# Patient Record
Sex: Female | Born: 1935 | Race: White | Hispanic: No | Marital: Married | Smoking: Never smoker
Health system: Southern US, Community
[De-identification: ages and names within clinical notes are randomized; demographics above are authoritative.]

## PROBLEM LIST (undated history)

## (undated) DIAGNOSIS — E785 Hyperlipidemia, unspecified: Secondary | ICD-10-CM

## (undated) DIAGNOSIS — C801 Malignant (primary) neoplasm, unspecified: Secondary | ICD-10-CM

## (undated) DIAGNOSIS — I10 Essential (primary) hypertension: Secondary | ICD-10-CM

## (undated) DIAGNOSIS — I2510 Atherosclerotic heart disease of native coronary artery without angina pectoris: Secondary | ICD-10-CM

## (undated) HISTORY — PX: TONSILLECTOMY: SUR1361

---

## 2000-08-28 DIAGNOSIS — I2510 Atherosclerotic heart disease of native coronary artery without angina pectoris: Secondary | ICD-10-CM

## 2000-08-28 DIAGNOSIS — C801 Malignant (primary) neoplasm, unspecified: Secondary | ICD-10-CM

## 2000-08-28 HISTORY — DX: Atherosclerotic heart disease of native coronary artery without angina pectoris: I25.10

## 2000-08-28 HISTORY — DX: Malignant (primary) neoplasm, unspecified: C80.1

## 2000-08-28 HISTORY — PX: NEPHRECTOMY: SHX65

## 2017-10-26 HISTORY — PX: ROTATOR CUFF REPAIR: SHX139

## 2018-01-25 ENCOUNTER — Observation Stay (HOSPITAL_COMMUNITY)
Admission: EM | Admit: 2018-01-25 | Discharge: 2018-01-28 | Disposition: A | Discharge status: Home / Self Care | Payer: Medicare Other | Attending: Internal Medicine | Admitting: Internal Medicine

## 2018-01-25 ENCOUNTER — Encounter (HOSPITAL_COMMUNITY): Payer: Self-pay | Admitting: Emergency Medicine

## 2018-01-25 ENCOUNTER — Emergency Department (HOSPITAL_COMMUNITY): Payer: Medicare Other

## 2018-01-25 DIAGNOSIS — R0609 Other forms of dyspnea: Secondary | ICD-10-CM

## 2018-01-25 DIAGNOSIS — I25110 Atherosclerotic heart disease of native coronary artery with unstable angina pectoris: Principal | ICD-10-CM | POA: Insufficient documentation

## 2018-01-25 DIAGNOSIS — F419 Anxiety disorder, unspecified: Secondary | ICD-10-CM | POA: Insufficient documentation

## 2018-01-25 DIAGNOSIS — J449 Chronic obstructive pulmonary disease, unspecified: Secondary | ICD-10-CM | POA: Insufficient documentation

## 2018-01-25 DIAGNOSIS — G4700 Insomnia, unspecified: Secondary | ICD-10-CM | POA: Diagnosis not present

## 2018-01-25 DIAGNOSIS — Z85528 Personal history of other malignant neoplasm of kidney: Secondary | ICD-10-CM | POA: Insufficient documentation

## 2018-01-25 DIAGNOSIS — I200 Unstable angina: Secondary | ICD-10-CM | POA: Insufficient documentation

## 2018-01-25 DIAGNOSIS — I209 Angina pectoris, unspecified: Secondary | ICD-10-CM

## 2018-01-25 DIAGNOSIS — Z87891 Personal history of nicotine dependence: Secondary | ICD-10-CM | POA: Diagnosis not present

## 2018-01-25 DIAGNOSIS — N179 Acute kidney failure, unspecified: Secondary | ICD-10-CM | POA: Diagnosis not present

## 2018-01-25 DIAGNOSIS — I25119 Atherosclerotic heart disease of native coronary artery with unspecified angina pectoris: Secondary | ICD-10-CM | POA: Diagnosis not present

## 2018-01-25 DIAGNOSIS — I129 Hypertensive chronic kidney disease with stage 1 through stage 4 chronic kidney disease, or unspecified chronic kidney disease: Secondary | ICD-10-CM | POA: Diagnosis not present

## 2018-01-25 DIAGNOSIS — N183 Chronic kidney disease, stage 3 (moderate): Secondary | ICD-10-CM | POA: Insufficient documentation

## 2018-01-25 DIAGNOSIS — R7989 Other specified abnormal findings of blood chemistry: Secondary | ICD-10-CM

## 2018-01-25 DIAGNOSIS — I2584 Coronary atherosclerosis due to calcified coronary lesion: Secondary | ICD-10-CM | POA: Diagnosis not present

## 2018-01-25 DIAGNOSIS — R0602 Shortness of breath: Secondary | ICD-10-CM

## 2018-01-25 DIAGNOSIS — E785 Hyperlipidemia, unspecified: Secondary | ICD-10-CM | POA: Insufficient documentation

## 2018-01-25 DIAGNOSIS — Z905 Acquired absence of kidney: Secondary | ICD-10-CM

## 2018-01-25 DIAGNOSIS — Z885 Allergy status to narcotic agent status: Secondary | ICD-10-CM | POA: Diagnosis not present

## 2018-01-25 DIAGNOSIS — I10 Essential (primary) hypertension: Secondary | ICD-10-CM | POA: Diagnosis not present

## 2018-01-25 DIAGNOSIS — I2510 Atherosclerotic heart disease of native coronary artery without angina pectoris: Secondary | ICD-10-CM

## 2018-01-25 HISTORY — DX: Hyperlipidemia, unspecified: E78.5

## 2018-01-25 HISTORY — DX: Atherosclerotic heart disease of native coronary artery without angina pectoris: I25.10

## 2018-01-25 HISTORY — DX: Malignant (primary) neoplasm, unspecified: C80.1

## 2018-01-25 HISTORY — DX: Essential (primary) hypertension: I10

## 2018-01-25 LAB — CBC
HCT: 40.6 % (ref 36.0–46.0)
HEMOGLOBIN: 13.2 g/dL (ref 12.0–15.0)
MCH: 29.3 pg (ref 26.0–34.0)
MCHC: 32.5 g/dL (ref 30.0–36.0)
MCV: 90 fL (ref 78.0–100.0)
Platelets: 244 10*3/uL (ref 150–400)
RBC: 4.51 MIL/uL (ref 3.87–5.11)
RDW: 13.6 % (ref 11.5–15.5)
WBC: 6.5 10*3/uL (ref 4.0–10.5)

## 2018-01-25 LAB — URINALYSIS, ROUTINE W REFLEX MICROSCOPIC
BILIRUBIN URINE: NEGATIVE
Bacteria, UA: NONE SEEN
GLUCOSE, UA: NEGATIVE mg/dL
Hgb urine dipstick: NEGATIVE
KETONES UR: NEGATIVE mg/dL
NITRITE: NEGATIVE
PH: 5 (ref 5.0–8.0)
Protein, ur: NEGATIVE mg/dL
SPECIFIC GRAVITY, URINE: 1.018 (ref 1.005–1.030)

## 2018-01-25 LAB — BASIC METABOLIC PANEL
ANION GAP: 9 (ref 5–15)
BUN: 24 mg/dL — ABNORMAL HIGH (ref 6–20)
CHLORIDE: 103 mmol/L (ref 101–111)
CO2: 28 mmol/L (ref 22–32)
Calcium: 9.1 mg/dL (ref 8.9–10.3)
Creatinine, Ser: 1.93 mg/dL — ABNORMAL HIGH (ref 0.44–1.00)
GFR calc non Af Amer: 23 mL/min — ABNORMAL LOW (ref >60)
GFR, EST AFRICAN AMERICAN: 27 mL/min — AB (ref >60)
Glucose, Bld: 101 mg/dL — ABNORMAL HIGH (ref 65–99)
POTASSIUM: 3.8 mmol/L (ref 3.5–5.1)
Sodium: 140 mmol/L (ref 135–145)

## 2018-01-25 LAB — I-STAT TROPONIN, ED: Troponin i, poc: 0 ng/mL (ref 0.00–0.08)

## 2018-01-25 LAB — D-DIMER, QUANTITATIVE (NOT AT ARMC): D-Dimer, Quant: 0.68 ug/mL-FEU — ABNORMAL HIGH (ref 0.00–0.50)

## 2018-01-25 MED ORDER — METOPROLOL TARTRATE 12.5 MG HALF TABLET
12.5000 mg | ORAL_TABLET | Freq: Two times a day (BID) | ORAL | Status: DC
  Administered 2018-01-26: 12.5 mg via ORAL
  Filled 2018-01-25: qty 1

## 2018-01-25 MED ORDER — ONDANSETRON HCL 4 MG/2ML IJ SOLN: 4.0000 mg | Freq: Four times a day (QID) | INTRAMUSCULAR | Status: DC | PRN

## 2018-01-25 MED ORDER — DEXTROSE-NACL 5-0.45 % IV SOLN
INTRAVENOUS | Status: DC
  Administered 2018-01-26 (×2): via INTRAVENOUS

## 2018-01-25 MED ORDER — ZOLPIDEM TARTRATE 5 MG PO TABS
5.0000 mg | ORAL_TABLET | Freq: Every day | ORAL | Status: DC
  Administered 2018-01-26 – 2018-01-28 (×3): 5 mg via ORAL
  Filled 2018-01-25 (×3): qty 1

## 2018-01-25 MED ORDER — HEPARIN SODIUM (PORCINE) 5000 UNIT/ML IJ SOLN
5000.0000 [IU] | Freq: Three times a day (TID) | INTRAMUSCULAR | Status: DC
  Administered 2018-01-26 – 2018-01-28 (×8): 5000 [IU] via SUBCUTANEOUS
  Filled 2018-01-25 (×8): qty 1

## 2018-01-25 MED ORDER — ACETAMINOPHEN 325 MG PO TABS: 650.0000 mg | ORAL_TABLET | ORAL | Status: DC | PRN

## 2018-01-25 MED ORDER — ASPIRIN 81 MG PO CHEW
324.0000 mg | CHEWABLE_TABLET | Freq: Once | ORAL | Status: AC
Start: 2018-01-25 — End: 2018-01-25
  Administered 2018-01-25: 324 mg via ORAL
  Filled 2018-01-25: qty 4

## 2018-01-25 NOTE — ED Triage Notes (Addendum)
Pt c/o SOB, tightness in her chest, nausea and heaviness in her abdomen. Pt has been c/o these since March 8th 2019 when she had shoulder surgery. Pt states the tighness and SOB has gotten worse today. Pt has no Cards Hx. A&Ox4 VS are normal.

## 2018-01-25 NOTE — ED Provider Notes (Signed)
[facility] EMERGENCY DEPARTMENT Provider Note   CSN: [id number] Arrival date & time: 01/25/18  1956     History   Chief Complaint Chief Complaint  Patient presents with  . Shortness of Breath    HPI April Booth is a 82 y.o. female presenting for evaluation of shortness of breath.  Pt states over the past several days, she has been having worsening dyspnea on exertion.  She reports significant shortness of breath, with decreasing distances.  While short of breath, she occasionally has a sweaty feeling and tightness.  She had similar episode after her surgery in March, which she attributed to anxiety.  When her sling was removed, she no longer had the symptoms.  They returned several weeks ago, and have progressively been worsening.  Patient states in 2002 she was found to have a blockage in her heart, no stent or surgical intervention was done, patient has been on Toprol since without further problems.  She had a rectum after incidental finding of kidney cancer in 2002, no further issues. She denies fevers, chills, cough, chest pain, nausea, vomiting, abdominal pain, abnormal bowel movements.  She reports urinary frequency and dysuria without hematuria.  She denies a h/o COPD, asthma, MI, DM.  She denies recent travel, surgeries, immobilization, hormone use, history of blood clot.  She quit smoking 20 years ago, denies alcohol or drug use.   HPI  Past Medical History:  Diagnosis Date  . Cancer ([place]) 2002    There are no active problems to display for this patient.   Past Surgical History:  Procedure Laterality Date  . TONSILLECTOMY       OB History   None      Home Medications    Prior to Admission medications   Medication Sig Start Date End Date Taking? Authorizing Provider  LISINOPRIL PO Take 0.5 tablets by mouth daily.   Yes [provider]  METOPROLOL SUCCINATE PO Take 1 tablet by mouth daily.   Yes [provider]  zolpidem  (AMBIEN) 10 MG tablet Take 10 mg by mouth at bedtime.   Yes [provider]    Family History No family history on file.  Social History Social History   Tobacco Use  . Smoking status: Not on file  Substance Use Topics  . Alcohol use: Not on file  . Drug use: Not on file     Allergies   Oxycodone   Review of Systems Review of Systems  Respiratory: Positive for shortness of breath (DOE).   Genitourinary: Positive for dysuria and frequency. Negative for hematuria.  All other systems reviewed and are negative.    Physical Exam Updated Vital Signs BP 123/68   Pulse 73   Temp 98 F (36.7 C) (Oral)   Resp 17   Ht 5\' 2"  (1.575 m)   Wt 60.3 kg (133 lb)   SpO2 99%   BMI 24.33 kg/m   Physical Exam  Constitutional: She is oriented to person, place, and time. She appears well-developed and well-nourished. No distress.  Pt resting comfortable in NAD  HENT:  Head: Normocephalic and atraumatic.  Eyes: Pupils are equal, round, and reactive to light. Conjunctivae and EOM are normal.  Neck: Normal range of motion. Neck supple.  Cardiovascular: Normal rate, regular rhythm and intact distal pulses.  Pulmonary/Chest: Effort normal and breath sounds normal. No respiratory distress. She has no wheezes.  Speaking full sentences.  Clear lung sounds in all fields.  No signs of respiratory distress.  Abdominal: Soft. She exhibits no distension and no mass. There is no tenderness. There is no guarding.  Musculoskeletal: Normal range of motion. She exhibits no edema or tenderness.  No leg pain or swelling  Neurological: She is alert and oriented to person, place, and time.  Skin: Skin is warm and dry. Capillary refill takes less than 2 seconds.  Psychiatric: She has a normal mood and affect.  Nursing note and vitals reviewed.    ED Treatments / Results  Labs (all labs ordered are listed, but only abnormal results are displayed) Labs Reviewed  BASIC METABOLIC PANEL -  Abnormal; Notable for the following components:      Result Value   Glucose, Bld 101 (*)    BUN 24 (*)    Creatinine, Ser 1.93 (*)    GFR calc non Af Amer 23 (*)    GFR calc Af Amer 27 (*)    All other components within normal limits  D-DIMER, QUANTITATIVE (NOT AT [facility]) - Abnormal; Notable for the following components:   D-Dimer, Quant 0.68 (*)    All other components within normal limits  URINALYSIS, ROUTINE W REFLEX MICROSCOPIC - Abnormal; Notable for the following components:   Leukocytes, UA TRACE (*)    All other components within normal limits  CBC  BRAIN NATRIURETIC PEPTIDE  I-STAT TROPONIN, ED    EKG EKG Interpretation  Date/Time:  Friday Jan 25 2018 20:08:22 EDT Ventricular Rate:  86 PR Interval:  176 QRS Duration: 78 QT Interval:  376 QTC Calculation: 449 R Axis:   84 Text Interpretation:  Normal sinus rhythm Septal infarct , age undetermined No previous tracing Confirmed by Blanchie Dessert [phone]) on 01/25/2018 8:44:06 PM   Radiology Dg Chest 2 View  Result Date: 01/25/2018 CLINICAL DATA:  Shortness of breath on exertion EXAM: CHEST - 2 VIEW COMPARISON:  None. FINDINGS: The heart size and mediastinal contours are within normal limits. Both lungs are hyperinflated but clear. The visualized skeletal structures are unremarkable. Extrinsic lead is noted over the left apex laterally. IMPRESSION: COPD without acute abnormality. Electronically Signed   By: Inez Catalina M.D.   On: 01/25/2018 20:32    Procedures Procedures (including critical care time)  Medications Ordered in ED Medications  aspirin chewable tablet 324 mg (has no administration in time range)     Initial Impression / Assessment and Plan / ED Course  I have reviewed the triage vital signs and the nursing notes.  Pertinent labs & imaging results that were available during my care of the patient were reviewed by me and considered in my medical decision making (see chart for details).     Patient  presented for evaluation of shortness of breath.  Physical exam shows patient is afebrile not tachycardic, appears nontoxic.  No obvious shortness of breath at rest.  Patient does not appear fluid overloaded.  Significant medical history including nephrectomy and previous blockage.  Will obtain EKG, labs, chest x-ray, and urine.  EKG without STEMI.  Labs show elevated creatinine, no previous to compare.  X-ray without pneumonia, effusions, pneumothorax.  Urine pending.  Case discussed with attending, Dr. Windy Carina evaluated the patient.  As patient is having classic anginal symptoms, and has previous known blockage which has not been intervened upon, will admit for further cardiac work-up.  D-Dimer mildly elevated, although when age-adjusted it is normal.  At this time, likely ACS instead of PE.  As patient has elevated creatinine and one kidney, will hold off on CTA.  Discussed  with Dr. Si Raider, pt to be [facility] to [facility] service. Will give 324 ASA.   Final Clinical Impressions(s) / ED Diagnoses   Final diagnoses:  Angina pectoris [facility])  DOE (dyspnea on exertion)    ED Discharge Orders    None       Franchot Heidelberg, PA-C 01/25/18 2220    Blanchie Dessert, MD 01/27/18 2141

## 2018-01-25 NOTE — H&P (Addendum)
History and Physical    April Booth IRC:[id number] DOB: [birth year] DOA: 01/25/2018  PCP: No primary care provider on file.  Patient coming from: home   Chief Complaint: dyspnea on exertion  HPI: April Booth is a 82 y.o. female with medical history significant for htn, nephrectomy for renal cancer (says is cured), unspecified CKD, CAD s/p cath ~2002 (no intervention), who presents with the above.  Right rotator cuff surgery in march. Sent home in a sling. Developed anxiety with the sling, and also developed episodes of mild dyspnea with exertion that seemed to resolve after sling was removed. No chest pain. Beginning about 3 days ago again began developing dyspnea with exertion, also mild. Resolves w/ rest. No chest pain or palpitations. No cough or hemoptysis. No leg or other swelling, no orthopnea. Hasn't happened prior. No history MI. No med changes (is insure of home metoprolol and lisinopril dose). Currently feeling well.   ED Course: labs, CXR, aspirin  Review of Systems: As per HPI otherwise 10 point review of systems negative.    Past Medical History:  Diagnosis Date  . Cancer ([place]) 2002    Past Surgical History:  Procedure Laterality Date  . TONSILLECTOMY       has no tobacco, alcohol, and drug history on file.  Allergies  Allergen Reactions  . Oxycodone Other (See Comments)    "made me fill weird and crazy"    No family history on file.  Prior to Admission medications   Medication Sig Start Date End Date Taking? Authorizing Provider  LISINOPRIL PO Take 0.5 tablets by mouth daily.   Yes [provider]  METOPROLOL SUCCINATE PO Take 1 tablet by mouth daily.   Yes [provider]  zolpidem (AMBIEN) 10 MG tablet Take 10 mg by mouth at bedtime.   Yes [provider]    Physical Exam: Vitals:   01/25/18 2004 01/25/18 2006 01/25/18 2045 01/25/18 2115  BP: (!) 159/76  135/79 123/68  Pulse: 88  81 73  Resp: 17  19 17   Temp: 98 F  (36.7 C)     TempSrc: Oral     SpO2: 98%  99% 99%  Weight:  60.3 kg (133 lb)    Height:  5\' 2"  (1.575 m)      Constitutional: No acute distress Head: Atraumatic Eyes: Conjunctiva clear ENM: Moist mucous membranes. Normal dentition.  Neck: Supple Respiratory: Clear to auscultation bilaterally, no wheezing/rales/rhonchi. Normal respiratory effort. No accessory muscle use. . Cardiovascular: Regular rate and rhythm. No murmurs/rubs/gallops. Abdomen: Non-tender, non-distended. No masses. No rebound or guarding. Positive bowel sounds. Musculoskeletal: No joint deformity upper and lower extremities. Normal ROM, no contractures. Normal muscle tone.  Skin: No rashes, lesions, or ulcers.  Extremities: No peripheral edema. Palpable peripheral pulses. Neurologic: Alert, moving all 4 extremities. Psychiatric: Normal insight and judgement.   Labs on Admission: I have personally reviewed following labs and imaging studies  CBC: Recent Labs  Lab 01/25/18 2054  WBC 6.5  HGB 13.2  HCT 40.6  MCV 90.0  PLT 510   Basic Metabolic Panel: Recent Labs  Lab 01/25/18 2054  NA 140  K 3.8  CL 103  CO2 28  GLUCOSE 101*  BUN 24*  CREATININE 1.93*  CALCIUM 9.1   GFR: Estimated Creatinine Clearance: 19.9 mL/min (A) (by C-G formula based on SCr of 1.93 mg/dL (H)). Liver Function Tests: No results for input(s): AST, ALT, ALKPHOS, BILITOT, PROT, ALBUMIN in the last 168 hours. No results for input(s): LIPASE, AMYLASE  in the last 168 hours. No results for input(s): AMMONIA in the last 168 hours. Coagulation Profile: No results for input(s): INR, PROTIME in the last 168 hours. Cardiac Enzymes: No results for input(s): CKTOTAL, CKMB, CKMBINDEX, TROPONINI in the last 168 hours. BNP (last 3 results) No results for input(s): PROBNP in the last 8760 hours. HbA1C: No results for input(s): HGBA1C in the last 72 hours. CBG: No results for input(s): GLUCAP in the last 168 hours. Lipid Profile: No  results for input(s): CHOL, HDL, LDLCALC, TRIG, CHOLHDL, LDLDIRECT in the last 72 hours. Thyroid Function Tests: No results for input(s): TSH, T4TOTAL, FREET4, T3FREE, THYROIDAB in the last 72 hours. Anemia Panel: No results for input(s): VITAMINB12, FOLATE, FERRITIN, TIBC, IRON, RETICCTPCT in the last 72 hours. Urine analysis:    Component Value Date/Time   COLORURINE YELLOW 01/25/2018 2134   APPEARANCEUR CLEAR 01/25/2018 2134   LABSPEC 1.018 01/25/2018 2134   PHURINE 5.0 01/25/2018 2134   GLUCOSEU NEGATIVE 01/25/2018 2134   HGBUR NEGATIVE 01/25/2018 2134   BILIRUBINUR NEGATIVE 01/25/2018 2134   KETONESUR NEGATIVE 01/25/2018 2134   PROTEINUR NEGATIVE 01/25/2018 2134   NITRITE NEGATIVE 01/25/2018 2134   LEUKOCYTESUR TRACE (A) 01/25/2018 2134    Radiological Exams on Admission: Dg Chest 2 View  Result Date: 01/25/2018 CLINICAL DATA:  Shortness of breath on exertion EXAM: CHEST - 2 VIEW COMPARISON:  None. FINDINGS: The heart size and mediastinal contours are within normal limits. Both lungs are hyperinflated but clear. The visualized skeletal structures are unremarkable. Extrinsic lead is noted over the left apex laterally. IMPRESSION: COPD without acute abnormality. Electronically Signed   By: Inez Catalina M.D.   On: 01/25/2018 20:32    EKG: Independently reviewed. Normal intervals, no ischemic changes  Assessment/Plan Active Problems:   Dyspnea on exertion   Creatinine elevation   CAD (coronary artery disease)   Essential hypertension   History of nephrectomy   Personal history of renal cancer    # Dyspnea on exertion -  EKG and initial troponin unremarkable, but heart score 4 with DOE that resolves with rest. CXR shows no acute process. CBC without anemia. No electrolyte abnormalities. No signs volume overload. No hypoxia or signs of DVT and d-dimer is within age cut-off.  - NPO  - TTE - trend troponins - f/u bnp - tele - am ecg  # Elevated creatinine - pt says was  told not too long ago by her PCP that her creatinine is somewhat elevated and that he planned to monitor it. Here cr 1.93. No recent hx of decreased PO or fluid losses, and no prior labs available for comparison. Bun is mildly elevated. Does have hx nephrectomy. - am bmp - gentle hydration with d51/2ns @ 100  # HTN - here bp wnl - hold home lisinopril given possible aki - pt's daughter to bring in lisinopril and metoprol bottles so that we can confirm dx. I'll start metop 12.5 bid for now  # Insomnia - continue home ambien  DVT prophylaxis: heparin Code Status: full  Family Communication: daughter Illene Labrador [phone]  Disposition Plan: tbd  Consults called: none  Admission status: tele     Desma Maxim MD [facility] Pager [phone]  If 7PM-7AM, please contact night-coverage [email or web address] Password [email or web address]  01/25/2018, 11:12 PM

## 2018-01-26 ENCOUNTER — Encounter (HOSPITAL_COMMUNITY): Payer: Self-pay | Admitting: *Deleted

## 2018-01-26 ENCOUNTER — Observation Stay (HOSPITAL_BASED_OUTPATIENT_CLINIC_OR_DEPARTMENT_OTHER): Payer: Medicare Other

## 2018-01-26 ENCOUNTER — Other Ambulatory Visit: Payer: Self-pay

## 2018-01-26 DIAGNOSIS — Z905 Acquired absence of kidney: Secondary | ICD-10-CM | POA: Diagnosis not present

## 2018-01-26 DIAGNOSIS — N179 Acute kidney failure, unspecified: Secondary | ICD-10-CM | POA: Diagnosis not present

## 2018-01-26 DIAGNOSIS — I25119 Atherosclerotic heart disease of native coronary artery with unspecified angina pectoris: Secondary | ICD-10-CM | POA: Diagnosis not present

## 2018-01-26 DIAGNOSIS — I2583 Coronary atherosclerosis due to lipid rich plaque: Secondary | ICD-10-CM

## 2018-01-26 DIAGNOSIS — N183 Chronic kidney disease, stage 3 (moderate): Secondary | ICD-10-CM

## 2018-01-26 DIAGNOSIS — I2510 Atherosclerotic heart disease of native coronary artery without angina pectoris: Secondary | ICD-10-CM | POA: Diagnosis not present

## 2018-01-26 DIAGNOSIS — I361 Nonrheumatic tricuspid (valve) insufficiency: Secondary | ICD-10-CM

## 2018-01-26 DIAGNOSIS — R7989 Other specified abnormal findings of blood chemistry: Secondary | ICD-10-CM | POA: Diagnosis not present

## 2018-01-26 DIAGNOSIS — R0609 Other forms of dyspnea: Secondary | ICD-10-CM | POA: Diagnosis not present

## 2018-01-26 DIAGNOSIS — I10 Essential (primary) hypertension: Secondary | ICD-10-CM | POA: Diagnosis not present

## 2018-01-26 DIAGNOSIS — I200 Unstable angina: Secondary | ICD-10-CM | POA: Diagnosis not present

## 2018-01-26 LAB — TROPONIN I: Troponin I: <0.03 ng/mL (ref <0.03)

## 2018-01-26 LAB — BASIC METABOLIC PANEL
ANION GAP: 10 (ref 5–15)
BUN: 23 mg/dL — ABNORMAL HIGH (ref 6–20)
CHLORIDE: 100 mmol/L — AB (ref 101–111)
CO2: 28 mmol/L (ref 22–32)
Calcium: 8.8 mg/dL — ABNORMAL LOW (ref 8.9–10.3)
Creatinine, Ser: 1.38 mg/dL — ABNORMAL HIGH (ref 0.44–1.00)
GFR calc non Af Amer: 35 mL/min — ABNORMAL LOW (ref >60)
GFR, EST AFRICAN AMERICAN: 41 mL/min — AB (ref >60)
GLUCOSE: 101 mg/dL — AB (ref 65–99)
Potassium: 3.6 mmol/L (ref 3.5–5.1)
Sodium: 138 mmol/L (ref 135–145)

## 2018-01-26 LAB — ECHOCARDIOGRAM COMPLETE
Height: 62 in
Weight: 2112.89 oz

## 2018-01-26 LAB — BRAIN NATRIURETIC PEPTIDE: B Natriuretic Peptide: 24.4 pg/mL (ref 0.0–100.0)

## 2018-01-26 LAB — MRSA PCR SCREENING: MRSA by PCR: NEGATIVE

## 2018-01-26 LAB — I-STAT TROPONIN, ED: TROPONIN I, POC: 0.01 ng/mL (ref 0.00–0.08)

## 2018-01-26 MED ORDER — METOPROLOL TARTRATE 25 MG PO TABS
25.0000 mg | ORAL_TABLET | Freq: Two times a day (BID) | ORAL | Status: DC
  Administered 2018-01-26 – 2018-01-27 (×2): 25 mg via ORAL
  Filled 2018-01-26 (×2): qty 1

## 2018-01-26 MED ORDER — ASPIRIN EC 81 MG PO TBEC
81.0000 mg | DELAYED_RELEASE_TABLET | Freq: Every day | ORAL | Status: DC
  Administered 2018-01-26 – 2018-01-27 (×2): 81 mg via ORAL
  Filled 2018-01-26 (×2): qty 1

## 2018-01-26 NOTE — Progress Notes (Signed)
  Echocardiogram 2D Echocardiogram has been performed.  April Booth G April Booth 01/26/2018, 11:20 AM

## 2018-01-26 NOTE — Progress Notes (Signed)
PROGRESS NOTE  Geet Hosking QBH:[id number] DOB: 05-Nov-1935 DOA: 01/25/2018 PCP: Patient, No Pcp Per   LOS: 0 days   Brief Narrative / Interim history: 82 year old female with history of hypertension, prior renal cancer status post nephrectomy, chronic kidney disease stage III, coronary artery disease with a cath in 2002 without intervention, who was admitted to the hospital on 5/31 with dyspnea with exertion as well as chest pressure.  Assessment & Plan: Principal Problem:   Crescendo angina ([place]) Active Problems:   Creatinine elevation   CAD (coronary artery disease)   Essential hypertension   History of nephrectomy   Personal history of renal cancer   Dyspnea on exertion / CAD -Troponin negative x3, EKG some ST segment changes which are nonspecific -She did have chest pressure associated with her dyspnea -Tells me that she has a cardiac catheterization in 2002 and 1 of the arteries was 80% blocked but decided to medical management -Concern for anginal equivalent, consult cardiology, appreciate input.  It appears that she will undergo cardiac catheterization  Acute kidney injury on chronic kidney disease stage III /solitary kidney -Baseline creatinine 1.2-1.4 as per records from [place], creatinine 1.9 on admission and improved to 1.38 with hydration this morning. -Continue to hold lisinopril, she is eating now and will stop IV fluids  Hypertension -Continue to hold lisinopril, on metoprolol   DVT prophylaxis: heparin Code Status: Full code Family Communication: no family at bedside Disposition Plan: cath pending   Consultants:   Cardiology   Procedures:   2D echo: pending  Antimicrobials:  None    Subjective: - no chest pain, shortness of breath, no abdominal pain, nausea or vomiting.   Objective: Vitals:   01/26/18 0313 01/26/18 0701 01/26/18 0935 01/26/18 1243  BP: (!) 147/79 138/75 137/66 125/64  Pulse: 70  75   Resp: 16   16  Temp: 98 F  (36.7 C) 97.8 F (36.6 C)  97.8 F (36.6 C)  TempSrc: Oral Oral  Oral  SpO2: 98%  98% 98%  Weight: 59.9 kg (132 lb 0.9 oz)     Height: 5\' 2"  (1.575 m)       Intake/Output Summary (Last 24 hours) at 01/26/2018 1415 Last data filed at 01/26/2018 0939 Gross per 24 hour  Intake 380 ml  Output -  Net 380 ml   Filed Weights   01/25/18 2006 01/26/18 0313  Weight: 60.3 kg (133 lb) 59.9 kg (132 lb 0.9 oz)    Examination:  Constitutional: NAD Eyes: lids and conjunctivae normal ENMT: Mucous membranes are moist.  Neck: normal, supple,  Respiratory: clear to auscultation bilaterally, no wheezing, no crackles. Normal respiratory effort. No accessory muscle use.  Cardiovascular: Regular rate and rhythm, no murmurs / rubs / gallops. No LE edema. 2+ pedal pulses. No carotid bruits.  Abdomen: no tenderness. Bowel sounds positive.  Skin: no rashes, lesions, ulcers. No induration Neurologic: non focal    Data Reviewed: I have independently reviewed following labs and imaging studies  CBC: Recent Labs  Lab 01/25/18 2054  WBC 6.5  HGB 13.2  HCT 40.6  MCV 90.0  PLT 097   Basic Metabolic Panel: Recent Labs  Lab 01/25/18 2054 01/26/18 0600  NA 140 138  K 3.8 3.6  CL 103 100*  CO2 28 28  GLUCOSE 101* 101*  BUN 24* 23*  CREATININE 1.93* 1.38*  CALCIUM 9.1 8.8*   GFR: Estimated Creatinine Clearance: 25.7 mL/min (A) (by C-G formula based on SCr of 1.38 mg/dL (H)). Liver  Function Tests: No results for input(s): AST, ALT, ALKPHOS, BILITOT, PROT, ALBUMIN in the last 168 hours. No results for input(s): LIPASE, AMYLASE in the last 168 hours. No results for input(s): AMMONIA in the last 168 hours. Coagulation Profile: No results for input(s): INR, PROTIME in the last 168 hours. Cardiac Enzymes: Recent Labs  Lab 01/26/18 0325 01/26/18 0600 01/26/18 0821  TROPONINI <0.03 <0.03 <0.03   BNP (last 3 results) No results for input(s): PROBNP in the last 8760 hours. HbA1C: No results  for input(s): HGBA1C in the last 72 hours. CBG: No results for input(s): GLUCAP in the last 168 hours. Lipid Profile: No results for input(s): CHOL, HDL, LDLCALC, TRIG, CHOLHDL, LDLDIRECT in the last 72 hours. Thyroid Function Tests: No results for input(s): TSH, T4TOTAL, FREET4, T3FREE, THYROIDAB in the last 72 hours. Anemia Panel: No results for input(s): VITAMINB12, FOLATE, FERRITIN, TIBC, IRON, RETICCTPCT in the last 72 hours. Urine analysis:    Component Value Date/Time   COLORURINE YELLOW 01/25/2018 2134   APPEARANCEUR CLEAR 01/25/2018 2134   LABSPEC 1.018 01/25/2018 2134   PHURINE 5.0 01/25/2018 2134   GLUCOSEU NEGATIVE 01/25/2018 2134   HGBUR NEGATIVE 01/25/2018 2134   BILIRUBINUR NEGATIVE 01/25/2018 2134   [place] NEGATIVE 01/25/2018 2134   PROTEINUR NEGATIVE 01/25/2018 2134   NITRITE NEGATIVE 01/25/2018 2134   LEUKOCYTESUR TRACE (A) 01/25/2018 2134   Sepsis Labs: Invalid input(s): PROCALCITONIN, LACTICIDVEN  Recent Results (from the past 240 hour(s))  MRSA PCR Screening     Status: None   Collection Time: 01/26/18  3:08 AM  Result Value Ref Range Status   MRSA by PCR NEGATIVE NEGATIVE Final    Comment:        The GeneXpert MRSA Assay (FDA approved for NASAL specimens only), is one component of a comprehensive MRSA colonization surveillance program. It is not intended to diagnose MRSA infection nor to guide or monitor treatment for MRSA infections. Performed at [facility], [place] [address], [place], [place] [address]       Radiology Studies: Dg Chest 2 View  Result Date: 01/25/2018 CLINICAL DATA:  Shortness of breath on exertion EXAM: CHEST - 2 VIEW COMPARISON:  None. FINDINGS: The heart size and mediastinal contours are within normal limits. Both lungs are hyperinflated but clear. The visualized skeletal structures are unremarkable. Extrinsic lead is noted over the left apex laterally. IMPRESSION: COPD without acute abnormality. Electronically  Signed   By: Inez Catalina M.D.   On: 01/25/2018 20:32     Scheduled Meds: . aspirin EC  81 mg Oral Daily  . heparin  5,000 Units Subcutaneous Q8H  . metoprolol tartrate  25 mg Oral BID  . zolpidem  5 mg Oral QHS   Continuous Infusions: . dextrose 5 % and 0.45% NaCl 100 mL/hr at 01/26/18 1026    Marzetta Board, MD, PhD [facility] Pager [phone]  If 7PM-7AM, please contact night-coverage [email or web address] Password [email or web address] 01/26/2018, 2:15 PM

## 2018-01-26 NOTE — Consult Note (Addendum)
Cardiology Consultation:   Patient ID: April Booth; [id number]; 1935-12-28   Admit date: 01/25/2018 Date of Consult: 01/26/2018  Primary Care Provider: Patient, No Pcp Per Primary Cardiologist: New Primary Electrophysiologist:  n/a   Patient Profile:   April Booth is a 82 y.o. female with a hx of cardiac eval 2002 including cath>>med rx for 80% blockage, L nephrectomy for kidney CA, HTN, HLD who is being seen today for the evaluation of chest pain at the request of Dr Cruzita Lederer.  History of Present Illness:   April Booth had rotator cuff surgery March 8th, has been recovering since then. She has done well w/ PT.   She stays busy but does not exercise. Prior to surgery, she was able to carry groceries, do housework, etc, w/out sx.  After the surgery, she took oxycodone for a couple of doses. Stopped because it made her feel really weird.   She was in a sling x 5 weeks. She felt she was not breathing that well and was having episodes of chest tightness and heaviness with exertion. After she got out of the sling, no improvement in sx.   No sx at rest, but got CP/SOB w/ climbing steps or other exertion. Worst was 8/10, all episodes associated with SOB. No diaphoresis. Episode yesterday was associated with some nausea.   Episode 2 weeks at [facility] was associated w/ light-headed feeling.   No hx palpitations, presyncope or syncope.  No GI issues.  No LE edema, orthopnea or PND. No DOE until she started getting the CP.    Past Medical History:  Diagnosis Date  . Cancer ([place]) 2002  . Coronary artery disease 2002   Prior Cath in [place], was told 1 artery was 80% blocked, no PCI  . HLD (hyperlipidemia)   . Hypertension     Past Surgical History:  Procedure Laterality Date  . NEPHRECTOMY Left 2002   for renal CA  . ROTATOR CUFF REPAIR Right 10/2017  . TONSILLECTOMY       Prior to Admission medications   Medication Sig Start Date End Date Taking? Authorizing Provider  LISINOPRIL  PO Take 0.5 tablets by mouth daily.   Yes [provider]  METOPROLOL SUCCINATE PO Take 1 tablet by mouth daily.   Yes [provider]  zolpidem (AMBIEN) 10 MG tablet Take 10 mg by mouth at bedtime.   Yes [provider]    Inpatient Medications: Scheduled Meds: . heparin  5,000 Units Subcutaneous Q8H  . metoprolol tartrate  12.5 mg Oral BID  . zolpidem  5 mg Oral QHS   Continuous Infusions: . dextrose 5 % and 0.45% NaCl 100 mL/hr at 01/26/18 1026   PRN Meds: acetaminophen, ondansetron (ZOFRAN) IV  Allergies:    Allergies  Allergen Reactions  . Oxycodone Other (See Comments)    "made me fill weird and crazy"    Social History:   Social History   Socioeconomic History  . Marital status: Married    Spouse name: Not on file  . Number of children: Not on file  . Years of education: Not on file  . Highest education level: Not on file  Occupational History  . Occupation: Retired  Scientific laboratory technician  . Financial resource strain: Not on file  . Food insecurity:    Worry: Not on file    Inability: Not on file  . Transportation needs:    Medical: Not on file    Non-medical: Not on file  Tobacco Use  . Smoking status:  Never Smoker  . Smokeless tobacco: Never Used  Substance and Sexual Activity  . Alcohol use: Not Currently  . Drug use: Never  . Sexual activity: Yes  Lifestyle  . Physical activity:    Days per week: Not on file    Minutes per session: Not on file  . Stress: Not on file  Relationships  . Social connections:    Talks on phone: Not on file    Gets together: Not on file    Attends religious service: Not on file    Active member of club or organization: Not on file    Attends meetings of clubs or organizations: Not on file    Relationship status: Not on file  . Intimate partner violence:    Fear of current or ex partner: Not on file    Emotionally abused: Not on file    Physically abused: Not on file    Forced sexual activity:  Not on file  Other Topics Concern  . Not on file  Social History Narrative  . Not on file    Family History:   Family History  Problem Relation Age of Onset  . Cancer Mother   . Cancer - Lung Father    Family Status:  Family Status  Relation Name Status  . Mother  Deceased  . Father  Deceased    ROS:  Please see the history of present illness.  All other ROS reviewed and negative.     Physical Exam/Data:   Vitals:   01/26/18 0313 01/26/18 0701 01/26/18 0935 01/26/18 1243  BP: (!) 147/79 138/75 137/66 125/64  Pulse: 70  75   Resp: 16   16  Temp: 98 F (36.7 C) 97.8 F (36.6 C)  97.8 F (36.6 C)  TempSrc: Oral Oral  Oral  SpO2: 98%  98% 98%  Weight: 132 lb 0.9 oz (59.9 kg)     Height: 5\' 2"  (1.575 m)       Intake/Output Summary (Last 24 hours) at 01/26/2018 1350 Last data filed at 01/26/2018 0939 Gross per 24 hour  Intake 380 ml  Output -  Net 380 ml   Filed Weights   01/25/18 2006 01/26/18 0313  Weight: 133 lb (60.3 kg) 132 lb 0.9 oz (59.9 kg)   Body mass index is 24.15 kg/m.  General:  Well nourished, well developed, in no acute distress HEENT: normal Lymph: no adenopathy Neck: no JVD Endocrine:  No thryomegaly Vascular: No carotid bruits; 4/4 extremity pulses 2+, without bruits  Cardiac:  normal S1, S2; RRR; no murmur  Lungs:  clear to auscultation bilaterally, no wheezing, rhonchi or rales  Abd: soft, nontender, no hepatomegaly  Ext: no edema Musculoskeletal:  No deformities, BUE and BLE strength normal and equal Skin: warm and dry  Neuro:  CNs 2-12 intact, no focal abnormalities noted Psych:  Normal affect   EKG:  The EKG was personally reviewed and demonstrates:  05/31, SR, HR 86, Diffuse ST depression, 1 mm or so Telemetry:  Telemetry was personally reviewed and demonstrates:  SR  Relevant CV Studies:  ECHO: performed, results pending  Laboratory Data:  Chemistry Recent Labs  Lab 01/25/18 2054 01/26/18 0600  NA 140 138  K 3.8 3.6    CL 103 100*  CO2 28 28  GLUCOSE 101* 101*  BUN 24* 23*  CREATININE 1.93* 1.38*  CALCIUM 9.1 8.8*  GFRNONAA 23* 35*  GFRAA 27* 41*  ANIONGAP 9 10    No results found for: ALT,  AST, GGT, ALKPHOS, BILITOT Hematology Recent Labs  Lab 01/25/18 2054  WBC 6.5  RBC 4.51  HGB 13.2  HCT 40.6  MCV 90.0  MCH 29.3  MCHC 32.5  RDW 13.6  PLT 244   Cardiac Enzymes Recent Labs  Lab 01/26/18 0325 01/26/18 0600 01/26/18 0821  TROPONINI <0.03 <0.03 <0.03    Recent Labs  Lab 01/25/18 2044 01/26/18 0005  TROPIPOC 0.00 0.01    BNP Recent Labs  Lab 01/25/18 2329  BNP 24.4    DDimer  Recent Labs  Lab 01/25/18 2055  DDIMER 0.68*   TSH: No results found for: TSH Lipids:No results found for: CHOL, HDL, LDLCALC, LDLDIRECT, TRIG, CHOLHDL HgbA1c:No results found for: HGBA1C Magnesium: No results found for: MG   Radiology/Studies:  Dg Chest 2 View  Result Date: 01/25/2018 CLINICAL DATA:  Shortness of breath on exertion EXAM: CHEST - 2 VIEW COMPARISON:  None. FINDINGS: The heart size and mediastinal contours are within normal limits. Both lungs are hyperinflated but clear. The visualized skeletal structures are unremarkable. Extrinsic lead is noted over the left apex laterally. IMPRESSION: COPD without acute abnormality. Electronically Signed   By: Inez Catalina M.D.   On: 01/25/2018 20:32    Assessment and Plan:   Principal Problem: 1.  Crescendo angina ([place]) - has been getting sx consistently w/ exertion since surgery - sx have progressed in intensity and associated sx - ez neg here - However, she has known CAD, previously treated medically. - add heparin if ez elevate, nitrates if pain recurs - add daily ASA - Feel there has been progression of CAD. - The risks and benefits of a cardiac catheterization including, but not limited to, death, stroke, MI, kidney damage and bleeding were discussed with the patient by Dr Radford Pax, who indicates understanding and agrees to  proceed.   Active Problems: 2.  Creatinine elevation - agree w/ d/c lisinopril and ongoing hydration  3.  CAD (coronary artery disease) - ck lipids/LFTs in am  4. Essential hypertension - BP slightly elevated w/out lisinopril  - Increase BB  Otherwise, per IM   History of nephrectomy   Personal history of renal cancer     For questions or updates, please contact [place] Please consult [email or web address] for contact info under Cardiology/STEMI.   Signed, Rosaria Ferries, PA-C  01/26/2018 1:50 PM

## 2018-01-27 DIAGNOSIS — R0609 Other forms of dyspnea: Secondary | ICD-10-CM

## 2018-01-27 DIAGNOSIS — I25119 Atherosclerotic heart disease of native coronary artery with unspecified angina pectoris: Secondary | ICD-10-CM | POA: Diagnosis not present

## 2018-01-27 DIAGNOSIS — I10 Essential (primary) hypertension: Secondary | ICD-10-CM | POA: Diagnosis not present

## 2018-01-27 DIAGNOSIS — I200 Unstable angina: Secondary | ICD-10-CM | POA: Diagnosis not present

## 2018-01-27 DIAGNOSIS — E785 Hyperlipidemia, unspecified: Secondary | ICD-10-CM

## 2018-01-27 DIAGNOSIS — R7989 Other specified abnormal findings of blood chemistry: Secondary | ICD-10-CM | POA: Diagnosis not present

## 2018-01-27 DIAGNOSIS — I2510 Atherosclerotic heart disease of native coronary artery without angina pectoris: Secondary | ICD-10-CM | POA: Diagnosis not present

## 2018-01-27 DIAGNOSIS — Z905 Acquired absence of kidney: Secondary | ICD-10-CM | POA: Diagnosis not present

## 2018-01-27 LAB — BASIC METABOLIC PANEL
Anion gap: 7 (ref 5–15)
BUN: 19 mg/dL (ref 6–20)
CALCIUM: 9 mg/dL (ref 8.9–10.3)
CO2: 30 mmol/L (ref 22–32)
CREATININE: 1.31 mg/dL — AB (ref 0.44–1.00)
Chloride: 103 mmol/L (ref 101–111)
GFR calc non Af Amer: 37 mL/min — ABNORMAL LOW (ref >60)
GFR, EST AFRICAN AMERICAN: 43 mL/min — AB (ref >60)
GLUCOSE: 96 mg/dL (ref 65–99)
Potassium: 4.4 mmol/L (ref 3.5–5.1)
Sodium: 140 mmol/L (ref 135–145)

## 2018-01-27 LAB — HEPATIC FUNCTION PANEL
ALT: 15 U/L (ref 14–54)
AST: 19 U/L (ref 15–41)
Albumin: 3.4 g/dL — ABNORMAL LOW (ref 3.5–5.0)
Alkaline Phosphatase: 62 U/L (ref 38–126)
BILIRUBIN TOTAL: 0.5 mg/dL (ref 0.3–1.2)
Total Protein: 5.6 g/dL — ABNORMAL LOW (ref 6.5–8.1)

## 2018-01-27 LAB — LIPID PANEL
CHOLESTEROL: 212 mg/dL — AB (ref 0–200)
HDL: 44 mg/dL (ref >40)
LDL Cholesterol: 141 mg/dL — ABNORMAL HIGH (ref 0–99)
TRIGLYCERIDES: 136 mg/dL (ref <150)
Total CHOL/HDL Ratio: 4.8 RATIO
VLDL: 27 mg/dL (ref 0–40)

## 2018-01-27 MED ORDER — SODIUM CHLORIDE 0.9 % IV SOLN
INTRAVENOUS | Status: DC
  Administered 2018-01-28: via INTRAVENOUS

## 2018-01-27 MED ORDER — ASPIRIN 81 MG PO CHEW
81.0000 mg | CHEWABLE_TABLET | ORAL | Status: AC
  Administered 2018-01-28: 81 mg via ORAL
  Filled 2018-01-27: qty 1

## 2018-01-27 MED ORDER — METOPROLOL TARTRATE 50 MG PO TABS
50.0000 mg | ORAL_TABLET | Freq: Two times a day (BID) | ORAL | Status: AC
  Administered 2018-01-27: 50 mg via ORAL
  Filled 2018-01-27: qty 1

## 2018-01-27 MED ORDER — ATORVASTATIN CALCIUM 40 MG PO TABS
40.0000 mg | ORAL_TABLET | Freq: Every day | ORAL | Status: DC
  Administered 2018-01-27 – 2018-01-28 (×2): 40 mg via ORAL
  Filled 2018-01-27 (×2): qty 1

## 2018-01-27 MED ORDER — SODIUM CHLORIDE 0.9% FLUSH: 3.0000 mL | INTRAVENOUS | Status: DC | PRN

## 2018-01-27 MED ORDER — SODIUM CHLORIDE 0.9% FLUSH
3.0000 mL | Freq: Two times a day (BID) | INTRAVENOUS | Status: DC
  Administered 2018-01-28: 3 mL via INTRAVENOUS

## 2018-01-27 MED ORDER — SODIUM CHLORIDE 0.9 % IV SOLN: 250.0000 mL | INTRAVENOUS | Status: DC | PRN

## 2018-01-27 MED ORDER — METOPROLOL SUCCINATE ER 50 MG PO TB24: 50.0000 mg | ORAL_TABLET | Freq: Every day | ORAL | Status: DC

## 2018-01-27 NOTE — Progress Notes (Signed)
Progress Note  Patient Name: April Booth Date of Encounter: 01/27/2018  Primary Cardiologist: Fransico Him, MD   Subjective   Denies any further chest pain or shortness of breath  Inpatient Medications    Scheduled Meds: . aspirin EC  81 mg Oral Daily  . heparin  5,000 Units Subcutaneous Q8H  . metoprolol tartrate  25 mg Oral BID  . zolpidem  5 mg Oral QHS   Continuous Infusions:  PRN Meds: acetaminophen, ondansetron (ZOFRAN) IV   Vital Signs    Vitals:   01/26/18 1933 01/27/18 0412 01/27/18 0804 01/27/18 1017  BP: (!) 158/70 136/68 (!) 153/70 137/74  Pulse: 97 71  90  Resp: 18 (!) 21 17   Temp: 97.9 F (36.6 C) 98 F (36.7 C) 98.2 F (36.8 C)   TempSrc: Oral Oral Oral   SpO2: 99% 96% 96%   Weight:      Height:        Intake/Output Summary (Last 24 hours) at 01/27/2018 1146 Last data filed at 01/26/2018 2223 Gross per 24 hour  Intake 462 ml  Output -  Net 462 ml   Filed Weights   01/25/18 2006 01/26/18 0313  Weight: 133 lb (60.3 kg) 132 lb 0.9 oz (59.9 kg)    Telemetry    Normal sinus rhythm- Personally Reviewed  ECG    No new EKG to review- Personally Reviewed  Physical Exam   GEN: No acute distress.   Neck: No JVD Cardiac: RRR, no murmurs, rubs, or gallops.  Respiratory: Clear to auscultation bilaterally. GI: Soft, nontender, non-distended  MS: No edema; No deformity. Neuro:  Nonfocal  Psych: Normal affect   Labs    Chemistry Recent Labs  Lab 01/25/18 2054 01/26/18 0600 01/27/18 0222  NA 140 138 140  K 3.8 3.6 4.4  CL 103 100* 103  CO2 28 28 30   GLUCOSE 101* 101* 96  BUN 24* 23* 19  CREATININE 1.93* 1.38* 1.31*  CALCIUM 9.1 8.8* 9.0  PROT  --   --  5.6*  ALBUMIN  --   --  3.4*  AST  --   --  19  ALT  --   --  15  ALKPHOS  --   --  62  BILITOT  --   --  0.5  GFRNONAA 23* 35* 37*  GFRAA 27* 41* 43*  ANIONGAP 9 10 7      Hematology Recent Labs  Lab 01/25/18 2054  WBC 6.5  RBC 4.51  HGB 13.2  HCT 40.6  MCV 90.0    MCH 29.3  MCHC 32.5  RDW 13.6  PLT 244    Cardiac Enzymes Recent Labs  Lab 01/26/18 0325 01/26/18 0600 01/26/18 0821  TROPONINI <0.03 <0.03 <0.03    Recent Labs  Lab 01/25/18 2044 01/26/18 0005  TROPIPOC 0.00 0.01     BNP Recent Labs  Lab 01/25/18 2329  BNP 24.4     DDimer  Recent Labs  Lab 01/25/18 2055  DDIMER 0.68*     Radiology    Dg Chest 2 View  Result Date: 01/25/2018 CLINICAL DATA:  Shortness of breath on exertion EXAM: CHEST - 2 VIEW COMPARISON:  None. FINDINGS: The heart size and mediastinal contours are within normal limits. Both lungs are hyperinflated but clear. The visualized skeletal structures are unremarkable. Extrinsic lead is noted over the left apex laterally. IMPRESSION: COPD without acute abnormality. Electronically Signed   By: Inez Catalina M.D.   On: 01/25/2018 20:32  Cardiac Studies   2D echo 01/26/2018 Study Conclusions  - Left ventricle: The cavity size was normal. Wall thickness was   normal. Systolic function was normal. The estimated ejection   fraction was in the range of 60% to 65%. Wall motion was normal;   there were no regional wall motion abnormalities. Doppler   parameters are consistent with abnormal left ventricular   relaxation (grade 1 diastolic dysfunction). - Atrial septum: No defect or patent foramen ovale was identified. - Tricuspid valve: There was mild regurgitation. - Pulmonary arteries: PA peak pressure: 45 mm Hg (S).  Patient Profile     82 y.o. female  with a history of a SCID with cardiac cath in 2002 showing 80% blockage but she does not know what artery it was.  Medical management was recommended.  She also has a history of hyperlipidemia, hypertension and renal cell carcinoma status post left nephrectomy in 2002.  She has been in her usual state of health but has not been really able to exercise because she had a rotator cuff surgery on November 02, 2017.  Prior to that though she was able to carry  groceries and do housework and go outside walking without any symptoms of chest pain or pressure or shortness of breath.  Recently she has noted several episodes of tightness in her chest with exertion as well as dyspnea on exertion.  She will noticed this with climbing up stairs or other exertional activities.  She had 2 episodes this week while in [employer] associated with a lightheaded feeling.  She had another episode of chest pain yesterday associated with nausea but no diaphoresis.   Assessment & Plan    1.  Crescendo angina ([place]) - has been getting sx consistently w/ exertion since rotator cuff surgery - sx have progressed in intensity and associated sx - Troponin negative x3 and BNP normal - she has known CAD, previously treated medically. - Cath planned for tomorrow - NPO after midnight - The risks and benefits of a cardiac catheterization including, but not limited to, death, stroke, MI, kidney damage and bleeding were discussed with the patient by Dr Radford Pax, who indicates understanding and agrees to proceed.  -If cath shows no progression of CAD then would get VQ scan to rule out PE since d-dimer very mildly elevated at 0.68  2.   Acute on chronic kidney disease -She is status post nephrectomy for renal cell carcinoma so she only has one kidney -Her ACE inhibitor and diuretic were stopped. -Creatinine improved 1.31 today -continue to gently hydrate  3.  CAD (coronary artery disease) -Per patient had a cath in 2002 showing 80% blockage but do not have prior cath to know what vessel this was.  She has been on medical management. -Continue ASA 81 mg daily and beta-blocker -LDL 141 so will start Lipitor 40 mg daily -Need an FLP and ALT 6 weeks  4. Essential hypertension - BP slightly elevated at 137/74 - 153/70 mmHg - Change back to home dose of beta-blocker which was Toprol-XL 50 mg daily - May need to add amlodipine if blood pressure remains elevated  5.  Hyperlipidemia -LDL  141 and therefore above her goal of less than 70 -Starting Lipitor 40 mg daily   For questions or updates, please contact [place] Please consult [email or web address] for contact info under Cardiology/STEMI.      Signed, Fransico Him, MD  01/27/2018, 11:46 AM

## 2018-01-27 NOTE — Progress Notes (Signed)
PROGRESS NOTE  April Booth EQA:[id number] DOB: 1936-01-15 DOA: 01/25/2018 PCP: Patient, No Pcp Per   LOS: 0 days   Brief Narrative / Interim history: 82 year old female with history of hypertension, prior renal cancer status post nephrectomy, chronic kidney disease stage III, coronary artery disease with a cath in 2002 without intervention, who was admitted to the hospital on 5/31 with dyspnea with exertion as well as chest pressure.  Assessment & Plan: Principal Problem:   Crescendo angina ([place]) Active Problems:   Creatinine elevation   CAD (coronary artery disease)   Essential hypertension   History of nephrectomy   Personal history of renal cancer   Dyspnea on exertion / CAD -Troponin negative x3, EKG some ST segment changes which are nonspecific -She did have chest pressure associated with her dyspnea -Tells me that she has a cardiac catheterization in 2002 and 1 of the arteries was 80% blocked but decided to medical management -Concern for anginal equivalent, consult cardiology, appreciate input.  It appears that she will undergo cardiac catheterization on Monday  Acute kidney injury on chronic kidney disease stage III /solitary kidney -Baseline creatinine 1.2-1.4 as per records from [place], creatinine 1.9 on admission and improved to 1.38 >> 1.31, now stable at baseline -Continue to hold lisinopril and will likely hold on discharge as well, she is eating now and will stop IV fluids  Hypertension -Continue to hold lisinopril, on metoprolol   DVT prophylaxis: heparin Code Status: Full code Family Communication: no family at bedside Disposition Plan: cath pending tomorrow  Consultants:   Cardiology   Procedures:   2D echo Study Conclusions - Left ventricle: The cavity size was normal. Wall thickness was normal. Systolic function was normal. The estimated ejection fraction was in the range of 60% to 65%. Wall motion was normal; there were no regional wall  motion abnormalities. Doppler parameters are consistent with abnormal left ventricular relaxation (grade 1 diastolic dysfunction). - Atrial septum: No defect or patent foramen ovale was identified. - Tricuspid valve: There was mild regurgitation. - Pulmonary arteries: PA peak pressure: 45 mm Hg (S).  Antimicrobials:  None    Subjective: -no chest pain but still had dyspnea with minimal activities.   Objective: Vitals:   01/26/18 1708 01/26/18 1933 01/27/18 0412 01/27/18 0804  BP: (!) 161/82 (!) 158/70 136/68 (!) 153/70  Pulse:  97 71   Resp:  18 (!) 21 17  Temp: 98.4 F (36.9 C) 97.9 F (36.6 C) 98 F (36.7 C) 98.2 F (36.8 C)  TempSrc: Oral Oral Oral Oral  SpO2: 98% 99% 96% 96%  Weight:      Height:        Intake/Output Summary (Last 24 hours) at 01/27/2018 0925 Last data filed at 01/26/2018 2223 Gross per 24 hour  Intake 462 ml  Output -  Net 462 ml   Filed Weights   01/25/18 2006 01/26/18 0313  Weight: 60.3 kg (133 lb) 59.9 kg (132 lb 0.9 oz)    Examination:  Constitutional: NAD Respiratory: CTA biL Cardiovascular: RRR  Data Reviewed: I have independently reviewed following labs and imaging studies  CBC: Recent Labs  Lab 01/25/18 2054  WBC 6.5  HGB 13.2  HCT 40.6  MCV 90.0  PLT 979   Basic Metabolic Panel: Recent Labs  Lab 01/25/18 2054 01/26/18 0600 01/27/18 0222  NA 140 138 140  K 3.8 3.6 4.4  CL 103 100* 103  CO2 28 28 30   GLUCOSE 101* 101* 96  BUN 24* 23*  19  CREATININE 1.93* 1.38* 1.31*  CALCIUM 9.1 8.8* 9.0   GFR: Estimated Creatinine Clearance: 27.1 mL/min (A) (by C-G formula based on SCr of 1.31 mg/dL (H)). Liver Function Tests: Recent Labs  Lab 01/27/18 0222  AST 19  ALT 15  ALKPHOS 62  BILITOT 0.5  PROT 5.6*  ALBUMIN 3.4*   No results for input(s): LIPASE, AMYLASE in the last 168 hours. No results for input(s): AMMONIA in the last 168 hours. Coagulation Profile: No results for input(s): INR, PROTIME in the last 168  hours. Cardiac Enzymes: Recent Labs  Lab 01/26/18 0325 01/26/18 0600 01/26/18 0821  TROPONINI <0.03 <0.03 <0.03   BNP (last 3 results) No results for input(s): PROBNP in the last 8760 hours. HbA1C: No results for input(s): HGBA1C in the last 72 hours. CBG: No results for input(s): GLUCAP in the last 168 hours. Lipid Profile: Recent Labs    01/27/18 0222  CHOL 212*  HDL 44  LDLCALC 141*  TRIG 136  CHOLHDL 4.8   Thyroid Function Tests: No results for input(s): TSH, T4TOTAL, FREET4, T3FREE, THYROIDAB in the last 72 hours. Anemia Panel: No results for input(s): VITAMINB12, FOLATE, FERRITIN, TIBC, IRON, RETICCTPCT in the last 72 hours. Urine analysis:    Component Value Date/Time   COLORURINE YELLOW 01/25/2018 2134   APPEARANCEUR CLEAR 01/25/2018 2134   LABSPEC 1.018 01/25/2018 2134   PHURINE 5.0 01/25/2018 2134   GLUCOSEU NEGATIVE 01/25/2018 2134   HGBUR NEGATIVE 01/25/2018 2134   BILIRUBINUR NEGATIVE 01/25/2018 2134   [place] NEGATIVE 01/25/2018 2134   PROTEINUR NEGATIVE 01/25/2018 2134   NITRITE NEGATIVE 01/25/2018 2134   LEUKOCYTESUR TRACE (A) 01/25/2018 2134   Sepsis Labs: Invalid input(s): PROCALCITONIN, LACTICIDVEN  Recent Results (from the past 240 hour(s))  MRSA PCR Screening     Status: None   Collection Time: 01/26/18  3:08 AM  Result Value Ref Range Status   MRSA by PCR NEGATIVE NEGATIVE Final    Comment:        The GeneXpert MRSA Assay (FDA approved for NASAL specimens only), is one component of a comprehensive MRSA colonization surveillance program. It is not intended to diagnose MRSA infection nor to guide or monitor treatment for MRSA infections. Performed at [facility], [place] [address], [place], [place] [address]       Radiology Studies: Dg Chest 2 View  Result Date: 01/25/2018 CLINICAL DATA:  Shortness of breath on exertion EXAM: CHEST - 2 VIEW COMPARISON:  None. FINDINGS: The heart size and mediastinal contours are within  normal limits. Both lungs are hyperinflated but clear. The visualized skeletal structures are unremarkable. Extrinsic lead is noted over the left apex laterally. IMPRESSION: COPD without acute abnormality. Electronically Signed   By: Inez Catalina M.D.   On: 01/25/2018 20:32     Scheduled Meds: . aspirin EC  81 mg Oral Daily  . heparin  5,000 Units Subcutaneous Q8H  . metoprolol tartrate  25 mg Oral BID  . zolpidem  5 mg Oral QHS   Continuous Infusions:   Marzetta Board, MD, PhD [facility] Pager [phone]  If 7PM-7AM, please contact night-coverage [email or web address] Password [facility] 01/27/2018, 9:25 AM

## 2018-01-27 NOTE — H&P (View-Only) (Signed)
Progress Note  Patient Name: April Booth Date of Encounter: 01/27/2018  Primary Cardiologist: Fransico Him, MD   Subjective   Denies any further chest pain or shortness of breath  Inpatient Medications    Scheduled Meds: . aspirin EC  81 mg Oral Daily  . heparin  5,000 Units Subcutaneous Q8H  . metoprolol tartrate  25 mg Oral BID  . zolpidem  5 mg Oral QHS   Continuous Infusions:  PRN Meds: acetaminophen, ondansetron (ZOFRAN) IV   Vital Signs    Vitals:   01/26/18 1933 01/27/18 0412 01/27/18 0804 01/27/18 1017  BP: (!) 158/70 136/68 (!) 153/70 137/74  Pulse: 97 71  90  Resp: 18 (!) 21 17   Temp: 97.9 F (36.6 C) 98 F (36.7 C) 98.2 F (36.8 C)   TempSrc: Oral Oral Oral   SpO2: 99% 96% 96%   Weight:      Height:        Intake/Output Summary (Last 24 hours) at 01/27/2018 1146 Last data filed at 01/26/2018 2223 Gross per 24 hour  Intake 462 ml  Output -  Net 462 ml   Filed Weights   01/25/18 2006 01/26/18 0313  Weight: 133 lb (60.3 kg) 132 lb 0.9 oz (59.9 kg)    Telemetry    Normal sinus rhythm- Personally Reviewed  ECG    No new EKG to review- Personally Reviewed  Physical Exam   GEN: No acute distress.   Neck: No JVD Cardiac: RRR, no murmurs, rubs, or gallops.  Respiratory: Clear to auscultation bilaterally. GI: Soft, nontender, non-distended  MS: No edema; No deformity. Neuro:  Nonfocal  Psych: Normal affect   Labs    Chemistry Recent Labs  Lab 01/25/18 2054 01/26/18 0600 01/27/18 0222  NA 140 138 140  K 3.8 3.6 4.4  CL 103 100* 103  CO2 28 28 30   GLUCOSE 101* 101* 96  BUN 24* 23* 19  CREATININE 1.93* 1.38* 1.31*  CALCIUM 9.1 8.8* 9.0  PROT  --   --  5.6*  ALBUMIN  --   --  3.4*  AST  --   --  19  ALT  --   --  15  ALKPHOS  --   --  62  BILITOT  --   --  0.5  GFRNONAA 23* 35* 37*  GFRAA 27* 41* 43*  ANIONGAP 9 10 7      Hematology Recent Labs  Lab 01/25/18 2054  WBC 6.5  RBC 4.51  HGB 13.2  HCT 40.6  MCV 90.0    MCH 29.3  MCHC 32.5  RDW 13.6  PLT 244    Cardiac Enzymes Recent Labs  Lab 01/26/18 0325 01/26/18 0600 01/26/18 0821  TROPONINI <0.03 <0.03 <0.03    Recent Labs  Lab 01/25/18 2044 01/26/18 0005  TROPIPOC 0.00 0.01     BNP Recent Labs  Lab 01/25/18 2329  BNP 24.4     DDimer  Recent Labs  Lab 01/25/18 2055  DDIMER 0.68*     Radiology    Dg Chest 2 View  Result Date: 01/25/2018 CLINICAL DATA:  Shortness of breath on exertion EXAM: CHEST - 2 VIEW COMPARISON:  None. FINDINGS: The heart size and mediastinal contours are within normal limits. Both lungs are hyperinflated but clear. The visualized skeletal structures are unremarkable. Extrinsic lead is noted over the left apex laterally. IMPRESSION: COPD without acute abnormality. Electronically Signed   By: Inez Catalina M.D.   On: 01/25/2018 20:32  Cardiac Studies   2D echo 01/26/2018 Study Conclusions  - Left ventricle: The cavity size was normal. Wall thickness was   normal. Systolic function was normal. The estimated ejection   fraction was in the range of 60% to 65%. Wall motion was normal;   there were no regional wall motion abnormalities. Doppler   parameters are consistent with abnormal left ventricular   relaxation (grade 1 diastolic dysfunction). - Atrial septum: No defect or patent foramen ovale was identified. - Tricuspid valve: There was mild regurgitation. - Pulmonary arteries: PA peak pressure: 45 mm Hg (S).  Patient Profile     82 y.o. female  with a history of a SCID with cardiac cath in 2002 showing 80% blockage but she does not know what artery it was.  Medical management was recommended.  She also has a history of hyperlipidemia, hypertension and renal cell carcinoma status post left nephrectomy in 2002.  She has been in her usual state of health but has not been really able to exercise because she had a rotator cuff surgery on November 02, 2017.  Prior to that though she was able to carry  groceries and do housework and go outside walking without any symptoms of chest pain or pressure or shortness of breath.  Recently she has noted several episodes of tightness in her chest with exertion as well as dyspnea on exertion.  She will noticed this with climbing up stairs or other exertional activities.  She had 2 episodes this week while in [employer] associated with a lightheaded feeling.  She had another episode of chest pain yesterday associated with nausea but no diaphoresis.   Assessment & Plan    1.  Crescendo angina ([place]) - has been getting sx consistently w/ exertion since rotator cuff surgery - sx have progressed in intensity and associated sx - Troponin negative x3 and BNP normal - she has known CAD, previously treated medically. - Cath planned for tomorrow - NPO after midnight - The risks and benefits of a cardiac catheterization including, but not limited to, death, stroke, MI, kidney damage and bleeding were discussed with the patient by Dr Radford Pax, who indicates understanding and agrees to proceed.  -If cath shows no progression of CAD then would get VQ scan to rule out PE since d-dimer very mildly elevated at 0.68  2.   Acute on chronic kidney disease -She is status post nephrectomy for renal cell carcinoma so she only has one kidney -Her ACE inhibitor and diuretic were stopped. -Creatinine improved 1.31 today -continue to gently hydrate  3.  CAD (coronary artery disease) -Per patient had a cath in 2002 showing 80% blockage but do not have prior cath to know what vessel this was.  She has been on medical management. -Continue ASA 81 mg daily and beta-blocker -LDL 141 so will start Lipitor 40 mg daily -Need an FLP and ALT 6 weeks  4. Essential hypertension - BP slightly elevated at 137/74 - 153/70 mmHg - Change back to home dose of beta-blocker which was Toprol-XL 50 mg daily - May need to add amlodipine if blood pressure remains elevated  5.  Hyperlipidemia -LDL  141 and therefore above her goal of less than 70 -Starting Lipitor 40 mg daily   For questions or updates, please contact [place] Please consult [email or web address] for contact info under Cardiology/STEMI.      Signed, Fransico Him, MD  01/27/2018, 11:46 AM

## 2018-01-27 NOTE — Plan of Care (Signed)
  Problem: Pain Managment: Goal: General experience of comfort will improve Outcome: Progressing   Problem: Clinical Measurements: Goal: Ability to maintain clinical measurements within normal limits will improve Outcome: Progressing   

## 2018-01-28 ENCOUNTER — Observation Stay (HOSPITAL_COMMUNITY): Payer: Medicare Other

## 2018-01-28 ENCOUNTER — Ambulatory Visit (HOSPITAL_COMMUNITY)
Admission: EM | Disposition: A | Discharge status: Home / Self Care | Payer: Self-pay | Source: Home / Self Care | Attending: Emergency Medicine

## 2018-01-28 ENCOUNTER — Encounter (HOSPITAL_COMMUNITY): Payer: Self-pay | Admitting: Interventional Cardiology

## 2018-01-28 DIAGNOSIS — I200 Unstable angina: Secondary | ICD-10-CM | POA: Diagnosis not present

## 2018-01-28 DIAGNOSIS — I25110 Atherosclerotic heart disease of native coronary artery with unstable angina pectoris: Secondary | ICD-10-CM | POA: Diagnosis not present

## 2018-01-28 DIAGNOSIS — I25119 Atherosclerotic heart disease of native coronary artery with unspecified angina pectoris: Secondary | ICD-10-CM | POA: Diagnosis not present

## 2018-01-28 DIAGNOSIS — I10 Essential (primary) hypertension: Secondary | ICD-10-CM | POA: Diagnosis not present

## 2018-01-28 DIAGNOSIS — Z905 Acquired absence of kidney: Secondary | ICD-10-CM | POA: Diagnosis not present

## 2018-01-28 HISTORY — PX: LEFT HEART CATH AND CORONARY ANGIOGRAPHY: CATH118249

## 2018-01-28 LAB — CBC
HEMATOCRIT: 37.6 % (ref 36.0–46.0)
HEMOGLOBIN: 12 g/dL (ref 12.0–15.0)
MCH: 29 pg (ref 26.0–34.0)
MCHC: 31.9 g/dL (ref 30.0–36.0)
MCV: 90.8 fL (ref 78.0–100.0)
Platelets: 220 10*3/uL (ref 150–400)
RBC: 4.14 MIL/uL (ref 3.87–5.11)
RDW: 13.3 % (ref 11.5–15.5)
WBC: 5.2 10*3/uL (ref 4.0–10.5)

## 2018-01-28 LAB — CREATININE, SERUM
Creatinine, Ser: 1.14 mg/dL — ABNORMAL HIGH (ref 0.44–1.00)
GFR, EST AFRICAN AMERICAN: 51 mL/min — AB (ref >60)
GFR, EST NON AFRICAN AMERICAN: 44 mL/min — AB (ref >60)

## 2018-01-28 LAB — PROTIME-INR
INR: 0.99
PROTHROMBIN TIME: 13 s (ref 11.4–15.2)

## 2018-01-28 SURGERY — LEFT HEART CATH AND CORONARY ANGIOGRAPHY
Anesthesia: LOCAL

## 2018-01-28 MED ORDER — HEPARIN SODIUM (PORCINE) 5000 UNIT/ML IJ SOLN: 5000.0000 [IU] | Freq: Three times a day (TID) | INTRAMUSCULAR | Status: DC

## 2018-01-28 MED ORDER — TECHNETIUM TO 99M ALBUMIN AGGREGATED
4.2000 | Freq: Once | INTRAVENOUS | Status: AC | PRN
  Administered 2018-01-28: 4.2 via INTRAVENOUS

## 2018-01-28 MED ORDER — SODIUM CHLORIDE 0.9% FLUSH: 3.0000 mL | INTRAVENOUS | Status: DC | PRN

## 2018-01-28 MED ORDER — TECHNETIUM TC 99M DIETHYLENETRIAME-PENTAACETIC ACID
29.9000 | Freq: Once | INTRAVENOUS | Status: AC | PRN
  Administered 2018-01-28: 29.9 via RESPIRATORY_TRACT

## 2018-01-28 MED ORDER — FENTANYL CITRATE (PF) 100 MCG/2ML IJ SOLN
INTRAMUSCULAR | Status: AC
  Filled 2018-01-28: qty 2

## 2018-01-28 MED ORDER — MIDAZOLAM HCL 2 MG/2ML IJ SOLN
INTRAMUSCULAR | Status: DC | PRN
  Administered 2018-01-28: 1 mg via INTRAVENOUS

## 2018-01-28 MED ORDER — IOPAMIDOL (ISOVUE-370) INJECTION 76%
INTRAVENOUS | Status: DC | PRN
  Administered 2018-01-28: 30 mL

## 2018-01-28 MED ORDER — ALBUTEROL SULFATE HFA 108 (90 BASE) MCG/ACT IN AERS: 2.0000 | INHALATION_SPRAY | Freq: Four times a day (QID) | RESPIRATORY_TRACT | 2 refills | Status: AC | PRN

## 2018-01-28 MED ORDER — SODIUM CHLORIDE 0.9 % IV SOLN: 250.0000 mL | INTRAVENOUS | Status: DC | PRN

## 2018-01-28 MED ORDER — ONDANSETRON HCL 4 MG/2ML IJ SOLN: 4.0000 mg | Freq: Four times a day (QID) | INTRAMUSCULAR | Status: DC | PRN

## 2018-01-28 MED ORDER — SODIUM CHLORIDE 0.9 % IV SOLN: INTRAVENOUS | Status: AC

## 2018-01-28 MED ORDER — SODIUM CHLORIDE 0.9% FLUSH: 3.0000 mL | Freq: Two times a day (BID) | INTRAVENOUS | Status: DC

## 2018-01-28 MED ORDER — AMLODIPINE BESYLATE 5 MG PO TABS: 5.0000 mg | ORAL_TABLET | Freq: Every day | ORAL | 1 refills | Status: AC

## 2018-01-28 MED ORDER — ASPIRIN 81 MG PO TBEC: 81.0000 mg | DELAYED_RELEASE_TABLET | Freq: Every day | ORAL | 1 refills | Status: AC

## 2018-01-28 MED ORDER — HEPARIN (PORCINE) IN NACL 1000-0.9 UT/500ML-% IV SOLN
INTRAVENOUS | Status: AC
  Filled 2018-01-28: qty 1000

## 2018-01-28 MED ORDER — LIDOCAINE HCL (PF) 1 % IJ SOLN
INTRAMUSCULAR | Status: AC
  Filled 2018-01-28: qty 30

## 2018-01-28 MED ORDER — VERAPAMIL HCL 2.5 MG/ML IV SOLN
INTRAVENOUS | Status: AC
  Filled 2018-01-28: qty 2

## 2018-01-28 MED ORDER — LIDOCAINE HCL (PF) 1 % IJ SOLN
INTRAMUSCULAR | Status: DC | PRN
  Administered 2018-01-28: 2 mL
  Administered 2018-01-28: 15 mL

## 2018-01-28 MED ORDER — ATORVASTATIN CALCIUM 40 MG PO TABS: 40.0000 mg | ORAL_TABLET | Freq: Every day | ORAL | 1 refills | Status: AC

## 2018-01-28 MED ORDER — ACETAMINOPHEN 325 MG PO TABS: 650.0000 mg | ORAL_TABLET | ORAL | Status: DC | PRN

## 2018-01-28 MED ORDER — HEPARIN SODIUM (PORCINE) 1000 UNIT/ML IJ SOLN
INTRAMUSCULAR | Status: AC
  Filled 2018-01-28: qty 1

## 2018-01-28 MED ORDER — FENTANYL CITRATE (PF) 100 MCG/2ML IJ SOLN
INTRAMUSCULAR | Status: DC | PRN
  Administered 2018-01-28: 25 ug via INTRAVENOUS

## 2018-01-28 MED ORDER — MIDAZOLAM HCL 2 MG/2ML IJ SOLN
INTRAMUSCULAR | Status: AC
  Filled 2018-01-28: qty 2

## 2018-01-28 SURGICAL SUPPLY — 13 items
CATH INFINITI 5FR MULTPACK ANG (CATHETERS) ×2 IMPLANT
COVER PRB 48X5XTLSCP FOLD TPE (BAG) ×1 IMPLANT
COVER PROBE 5X48 (BAG) ×1
GUIDEWIRE INQWIRE 1.5J.035X260 (WIRE) ×1 IMPLANT
INQWIRE 1.5J .035X260CM (WIRE) ×2
KIT HEART LEFT (KITS) ×2 IMPLANT
NEEDLE PERC 21GX4CM (NEEDLE) ×2 IMPLANT
PACK CARDIAC CATHETERIZATION (CUSTOM PROCEDURE TRAY) ×2 IMPLANT
SHEATH PINNACLE 5F 10CM (SHEATH) ×2 IMPLANT
SHEATH RAIN RADIAL 21G 6FR (SHEATH) ×2 IMPLANT
TRANSDUCER W/STOPCOCK (MISCELLANEOUS) ×2 IMPLANT
TUBING CIL FLEX 10 FLL-RA (TUBING) ×2 IMPLANT
WIRE EMERALD 3MM-J .035X150CM (WIRE) ×2 IMPLANT

## 2018-01-28 NOTE — Progress Notes (Signed)
Site area: RFA x1 Site Prior to Removal:  Level 0 Pressure Applied For: 20 minutes Manual:   yes Patient Status During Pull:  stable Post Pull Site:  Level 0 Post Pull Instructions Given: yes  Post Pull Pulses Present: palpable Dressing Applied:  tegaderm and gauze Bedrest begins @ 1048 Comments:

## 2018-01-28 NOTE — Interval H&P Note (Signed)
Cath Lab Visit (complete for each Cath Lab visit)  Clinical Evaluation Leading to the Procedure:   ACS: Yes.    Non-ACS:    Anginal Classification: CCS IV  Anti-ischemic medical therapy: Minimal Therapy (1 class of medications)  Non-Invasive Test Results: No non-invasive testing performed  Prior CABG: No previous CABG      History and Physical Interval Note:  01/28/2018 9:41 AM  April Booth  has presented today for surgery, with the diagnosis of unstable angina  The various methods of treatment have been discussed with the patient and family. After consideration of risks, benefits and other options for treatment, the patient has consented to  Procedure(s): LEFT HEART CATH AND CORONARY ANGIOGRAPHY (N/A) as a surgical intervention .  The patient's history has been reviewed, patient examined, no change in status, stable for surgery.  I have reviewed the patient's chart and labs.  Questions were answered to the patient's satisfaction.     April Booth

## 2018-01-28 NOTE — Plan of Care (Signed)
Pt. Received all discharge paperwork and written prescriptions. Pt. Verbalized understanding and had all belonging at time of discharge.

## 2018-01-28 NOTE — Discharge Summary (Addendum)
Physician Discharge Summary  April Booth KXF:[id number] DOB: 1936-02-21 DOA: 01/25/2018  PCP: Patient, No Pcp Per  Admit date: 01/25/2018 Discharge date: 01/28/2018  Admitted From: home Disposition:  home  Recommendations for Outpatient Follow-up:  1. Follow up with PCP in 1-2 weeks 2. Please obtain BMP/CBC in one week 3. PCP - please refer for PFTs  Home Health: none Equipment/Devices: none  Discharge Condition: stable CODE STATUS: Full code Diet recommendation: heart healthy  HPI: Per Dr. Si Raider, April Booth is a 82 y.o. female with medical history significant for htn, nephrectomy for renal cancer (says is cured), unspecified CKD, CAD s/p cath ~2002 (no intervention), who presents with the above. Right rotator cuff surgery in march. Sent home in a sling. Developed anxiety with the sling, and also developed episodes of mild dyspnea with exertion that seemed to resolve after sling was removed. No chest pain. Beginning about 3 days ago again began developing dyspnea with exertion, also mild. Resolves w/ rest. No chest pain or palpitations. No cough or hemoptysis. No leg or other swelling, no orthopnea. Hasn't happened prior. No history MI. No med changes (is insure of home metoprolol and lisinopril dose). Currently feeling well.   Hospital Course: Dyspnea on exertion / CAD -patient was admitted to the hospital with DOE with known CAD. Cardiology was consulted and followed patient while hospitalized.Troponin negative x3, EKG some ST segment changes which are nonspecific. Patient underwent a cardiac catheterization which showed non obstructive CAD, recommending medical management. Given slightly elevated D dimer (although normal if age corrected), a VQ scan was done which was without appreciable ventilation / perfusion mismatch, with an overall low probability for a PE. CT scan of the chest without contrast showed non specific small nodules which are unlikely to explain her dyspnea. These  will need to be followed up upon in the outpatient setting. I further recommend PFTs in outpatient setting, this was discussed with the patient and her son. Based on imaging, she does appear to have underlying COPD, and an PRN inhaler was prescribed.  Post cath, her groin site looks well, patient has completed post cath steps and is able to independently ambulate in the room without discomfort. Discussed d/c plans with Dr. Domenic Polite who is on call for cardiology.  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 14. I would favor to hold lisinopril HCTZ for now, will change her antihypertensive regimen to Amlodipine. She already has follow up with her PCP next week, recommended to recheck renal function.  Hypertension -Continue to hold lisinopril, on metoprolol, amlodipine added  Discharge Diagnoses:  Principal Problem:   Crescendo angina [facility]) Active Problems:   Creatinine elevation   CAD (coronary artery disease)   Essential hypertension   History of nephrectomy   Personal history of renal cancer   Hyperlipidemia LDL goal <70   Discharge Instructions  Allergies as of 01/28/2018      Reactions   Oxycodone Other (See Comments)   "made me fill weird and crazy"      Medication List    STOP taking these medications   lisinopril-hydrochlorothiazide 10-12.5 MG tablet Commonly known as:  PRINZIDE,ZESTORETIC     TAKE these medications   albuterol 108 (90 Base) MCG/ACT inhaler Commonly known as:  PROVENTIL HFA;VENTOLIN HFA Inhale 2 puffs into the lungs every 6 (six) hours as needed for wheezing or shortness of breath.  amLODipine 5 MG tablet Commonly known as:  NORVASC Take 1 tablet (5 mg total) by mouth daily.   aspirin 81 MG EC tablet Take 1 tablet (81 mg total) by mouth daily.   atorvastatin 40 MG tablet Commonly known as:   LIPITOR Take 1 tablet (40 mg total) by mouth daily at 6 PM. Start taking on:  01/29/2018   clonazePAM 0.5 MG tablet Commonly known as:  KLONOPIN Take 0.5 mg by mouth 2 (two) times daily as needed for anxiety.   metoprolol succinate 50 MG 24 hr tablet Commonly known as:  TOPROL-XL Take 50 mg by mouth daily.   zolpidem 10 MG tablet Commonly known as:  AMBIEN Take 10 mg by mouth at bedtime as needed for sleep.      Follow-up Information    Sueanne Margarita, MD. Schedule an appointment as soon as possible for a visit in 4 week(s).   Specialty:  Cardiology Contact information: [address] [place] [address] [phone]           Consultations:  Cardiology   Procedures/Studies:  2D echo  Study Conclusions - Left ventricle: The cavity size was normal. Wall thickness was normal. Systolic function was normal. The estimated ejection fraction was in the range of 60% to 65%. Wall motion was normal; there were no regional wall motion abnormalities. Doppler parameters are consistent with abnormal left ventricular relaxation (grade 1 diastolic dysfunction). - Atrial septum: No defect or patent foramen ovale was identified - Tricuspid valve: There was mild regurgitation. - Pulmonary arteries: PA peak pressure: 45 mm Hg (S).   Cardiac cath  Ost Cx to Prox Cx lesion is 25% stenosed.  Mid LAD lesion is 25% stenosed.  Prox LAD lesion is 30% stenosed.  Proximal 1st Diag lesion is 70% stenosed. This is a small vessel, not amenable to PCI.  LV end diastolic pressure is normal.  There is no aortic valve stenosis.  Unable to obtain right radial access.   Moderate, nonobstructive disease in the main vessels.  Continue medical therapy.   Dg Chest 1 View  Result Date: 01/28/2018 CLINICAL DATA:  Dyspnea on exertion. EXAM: CHEST  1 VIEW COMPARISON:  Radiographs of Jan 25, 2018. FINDINGS: The heart size and mediastinal contours are within normal limits. Both lungs  are clear. No pneumothorax or pleural effusion is noted. The visualized skeletal structures are unremarkable. IMPRESSION: No acute cardiopulmonary abnormality seen. Electronically Signed   By: Marijo Conception, M.D.   On: 01/28/2018 14:28   Dg Chest 2 View  Result Date: 01/25/2018 CLINICAL DATA:  Shortness of breath on exertion EXAM: CHEST - 2 VIEW COMPARISON:  None. FINDINGS: The heart size and mediastinal contours are within normal limits. Both lungs are hyperinflated but clear. The visualized skeletal structures are unremarkable. Extrinsic lead is noted over the left apex laterally. IMPRESSION: COPD without acute abnormality. Electronically Signed   By: Inez Catalina M.D.   On: 01/25/2018 20:32   Ct Chest Wo Contrast  Result Date: 01/28/2018 CLINICAL DATA:  82 year old female with exertional dyspnea over the past few days. Subsequent encounter. EXAM: CT CHEST WITHOUT CONTRAST TECHNIQUE: Multidetector CT imaging of the chest was performed following the standard protocol without IV contrast. COMPARISON:  01/28/2018 and 01/25/2018 chest x-ray. 01/28/2018 ventilation perfusion study. FINDINGS: Cardiovascular: Heart size within normal limits. Prominent coronary artery calcifications. Trace pericardial fluid. Calcified plaque throughout the thoracic aorta. Ascending thoracic aorta measures up to 3.5 cm. Main pulmonary artery does not appear  enlarged. Mediastinum/Nodes: Minimal regularity right lobe with thyroid gland. No mediastinal or hilar adenopathy. Lungs/Pleura: Nonspecific increased markings peripheral aspect of the lungs slightly more notable upper lungs. Trachea and mainstem bronchi are patent. No bronchiectasis or significant peribronchial thickening. Six small bilateral noncalcified nodules. Largest 6 mm (posterior right lung base series 4, image 124 and anterolateral left lung base series 4, image 111). Upper Abdomen: Small hiatal hernia. Left lobe liver low-density structure possibly a cyst. Radiopaque  material right upper pole may represent residual contrast or renal stone. Musculoskeletal: Kyphoscoliosis thoracic spine with superimposed degenerative changes without focal compression fracture. IMPRESSION: Mild nonspecific increased markings peripheral aspect of the lungs slightly more notable upper lungs. Six small bilateral noncalcified nodules. Largest 6 mm (posterior right lung base series 4, image 124 and anterolateral left lung base series 4, image 111). Per EPIC, patient had nephrectomy for left renal cell carcinoma in 2002. Therefore, metastatic disease cannot be excluded although pulmonary nodules may be inflammatory or post infectious in origin. These are most likely too small to adequately characterize by PET-CT. Aortic Atherosclerosis (ICD10-I70.0). Coronary artery calcifications. Radiopaque material right upper pole may represent residual contrast or renal stone. Electronically Signed   By: Genia Del M.D.   On: 01/28/2018 18:27   Nm Pulmonary Perf And Vent  Result Date: 01/28/2018 CLINICAL DATA:  Shortness of breath EXAM: NUCLEAR MEDICINE VENTILATION - PERFUSION LUNG SCAN VIEWS: Anterior, posterior, left lateral, right lateral, RPO, LPO, RAO, LAO-ventilation perfusion RADIOPHARMACEUTICALS:  29.9 mCi of Tc-42m DTPA aerosol inhalation and 4.2 mCi Tc24m-MAA IV COMPARISON:  Chest radiograph January 28, 2018 FINDINGS: Ventilation: There is decreased uptake in the apical segment of each upper lobe. There is also decreased uptake in the posterior segment of the left and right upper lobes. Note chest radiograph shows evidence emphysematous change in the upper lobes bilaterally. No other ventilation defects are evident. Perfusion: There is decreased radiotracer uptake in the apical and posterior segments of each upper lobe, matching ventilation defects and corresponding with chest radiographic findings of emphysematous change in the upper lobes. There is no appreciable perfusion defect without a  corresponding ventilation defect. IMPRESSION: Decreased ventilation and perfusion in apical and posterior segments of each upper lobe with chest radiographic abnormality in these areas. There is no appreciable ventilation/perfusion mismatch. This study corresponds to an overall low probability of pulmonary embolus based on PIOPED II criteria. Electronically Signed   By: Lowella Grip III M.D.   On: 01/28/2018 14:20     Subjective: - no chest pain, shortness of breath, no abdominal pain, nausea or vomiting.   Discharge Exam: Vitals:   01/28/18 1040 01/28/18 1045  BP: 133/62 121/61  Pulse: 78 76  Resp: 20 15  Temp:    SpO2: 97% 98%    General: Pt is alert, awake, not in acute distress Cardiovascular: RRR, S1/S2 +, no rubs, no gallops Respiratory: CTA bilaterally, no wheezing, no rhonchi Abdominal: Soft, NT, ND, bowel sounds +   The results of significant diagnostics from this hospitalization (including imaging, microbiology, ancillary and laboratory) are listed below for reference.     Microbiology: Recent Results (from the past 240 hour(s))  MRSA PCR Screening     Status: None   Collection Time: 01/26/18  3:08 AM  Result Value Ref Range Status   MRSA by PCR NEGATIVE NEGATIVE Final    Comment:        The GeneXpert MRSA Assay (FDA approved for NASAL specimens only), is one component of a comprehensive MRSA colonization  surveillance program. It is not intended to diagnose MRSA infection nor to guide or monitor treatment for MRSA infections. Performed at [facility], [place] [address], [place], [place] [address]      Labs: BNP (last 3 results) Recent Labs    01/25/18 2329  BNP 87.6   Basic Metabolic Panel: Recent Labs  Lab 01/25/18 2054 01/26/18 0600 01/27/18 0222 01/28/18 1150  NA 140 138 140  --   K 3.8 3.6 4.4  --   CL 103 100* 103  --   CO2 28 28 30   --   GLUCOSE 101* 101* 96  --   BUN 24* 23* 19  --   CREATININE 1.93* 1.38* 1.31* 1.14*   CALCIUM 9.1 8.8* 9.0  --    Liver Function Tests: Recent Labs  Lab 01/27/18 0222  AST 19  ALT 15  ALKPHOS 62  BILITOT 0.5  PROT 5.6*  ALBUMIN 3.4*   No results for input(s): LIPASE, AMYLASE in the last 168 hours. No results for input(s): AMMONIA in the last 168 hours. CBC: Recent Labs  Lab 01/25/18 2054 01/28/18 1150  WBC 6.5 5.2  HGB 13.2 12.0  HCT 40.6 37.6  MCV 90.0 90.8  PLT 244 220   Cardiac Enzymes: Recent Labs  Lab 01/26/18 0325 01/26/18 0600 01/26/18 0821  TROPONINI <0.03 <0.03 <0.03   BNP: Invalid input(s): POCBNP CBG: No results for input(s): GLUCAP in the last 168 hours. D-Dimer Recent Labs    01/25/18 2055  DDIMER 0.68*   Hgb A1c No results for input(s): HGBA1C in the last 72 hours. Lipid Profile Recent Labs    01/27/18 0222  CHOL 212*  HDL 44  LDLCALC 141*  TRIG 136  CHOLHDL 4.8   Thyroid function studies No results for input(s): TSH, T4TOTAL, T3FREE, THYROIDAB in the last 72 hours.  Invalid input(s): FREET3 Anemia work up No results for input(s): VITAMINB12, FOLATE, FERRITIN, TIBC, IRON, RETICCTPCT in the last 72 hours. Urinalysis    Component Value Date/Time   COLORURINE YELLOW 01/25/2018 2134   APPEARANCEUR CLEAR 01/25/2018 2134   LABSPEC 1.018 01/25/2018 2134   PHURINE 5.0 01/25/2018 2134   GLUCOSEU NEGATIVE 01/25/2018 2134   HGBUR NEGATIVE 01/25/2018 2134   BILIRUBINUR NEGATIVE 01/25/2018 2134   [place] NEGATIVE 01/25/2018 2134   PROTEINUR NEGATIVE 01/25/2018 2134   NITRITE NEGATIVE 01/25/2018 2134   LEUKOCYTESUR TRACE (A) 01/25/2018 2134   Sepsis Labs Invalid input(s): PROCALCITONIN,  WBC,  LACTICIDVEN   Time coordinating discharge: 40 minutes  SIGNED:  Marzetta Board, MD  [facility] 01/28/2018, 7:01 PM Pager [phone]  If 7PM-7AM, please contact night-coverage [email or web address] Password [email or web address]

## 2018-01-28 NOTE — Progress Notes (Deleted)
Attending MD note  April Booth was seen, examined,treatment plan was discussed with the PA-S.  I have personally reviewed the clinical findings, lab, imaging studies and management of this April Booth in detail. I agree with the documentation, as recorded by the PA-S  April Booth is  82 year old female with history of hypertension, prior renal cancer status post nephrectomy, chronic kidney disease stage III, coronary artery disease with a cath in 2002 without intervention, who was admitted to the hospital on 5/31 with dyspnea with exertion as well as chest pressure.  BP 121/61   Pulse 76   Temp 98.2 F (36.8 C) (Oral)   Resp 15   Ht 5\' 2"  (1.575 m)   Wt 60.1 kg (132 lb 6.4 oz)   SpO2 98%   BMI 24.22 kg/m  On Exam: Gen. exam: Awake, alert, not in any distress Chest: Good air entry bilaterally, no rhonchi or rales CVS: S1-S2 regular, no murmurs Abdomen: Soft, nontender and nondistended Neuro: ambulatory  Plan: Dyspnea on exertion / CAD -Troponin negative x3, EKG some ST segment changes which are nonspecific -She did have chest pressure associated with her dyspnea -Tells me that she has a cardiac catheterization in 2002 and 1 of the arteries was 80% blocked but decided to medical management -Cardiology was consulted and the April Booth was taken to the catheterization lab on 6/3, she has coronary artery disease with moderate stenosis throughout, and one lesion not amenable to PCI due to small vessel.  For now recommend medical management -She remains significantly dyspneic today -Given catheter results, will pursue lung evaluation with CT without contrast as well as a VQ scan.  Acute kidney injury on chronic kidney disease stage III /solitary kidney -Baseline creatinine 1.2-1.4 as per records from [place], creatinine 1.9 on admission and improved to 1.38 >> 1. 14, now stable at baseline -Continue to hold lisinopril and will likely hold on discharge as well, she is eating now and will stop  IV fluids -Would like to avoid IV contrast due to solitary kidney and baseline chronic kidney disease stage III  Hypertension -Continue to hold lisinopril, on metoprolol -will discontinue lisinopril indefinitely on discharge    Rest as below  Mikeria Valin M. Cruzita Lederer, MD [facility] [phone]  If 7PM-7AM, please contact night-coverage [email or web address] Password [email or web address]    Marzetta Board, MD [facility] [phone]   PROGRESS NOTE  Sindhu Nguyen CHE:[id number] DOB: 1936-08-24 DOA: 01/25/2018 PCP: April Booth, No Pcp Per   LOS: 0 days   Brief Narrative / Interim history: April Booth is an 82 y.o. Female with history of hypertension, prior renal cancer post nephrectomy, CKD stage III, CAD with heart catheterization in 2002 without intervention, admitted to the hospital 5/31 for increasing dyspnea on exertion with associated chest pressure. She underwent heart catheterization today (6/3) which showed no major blockages.   Assessment & Plan: Principal Problem:   Crescendo angina (Progress) Active Problems:   Creatinine elevation   CAD (coronary artery disease)   Essential hypertension   History of nephrectomy   Personal history of renal cancer   Hyperlipidemia LDL goal <70   Crescendo angina/CAD -Cardiac cath showed moderate, nonobstructive disease in main vessels. Continue medical therapy recommended. -Troponin x3 negative, EKG showed non specific ST changes, CXR showed COPD without acute abnormality -Echocardiogram show EF of 60-65% with normal systolic function and grade 1 diastolic dysfunction -Previous cath in 2002 with small vessel 80% blocked, no intervention necessary -Obtain CT chest and VQ scan for further evaluation  AKI on CKD stage III -  Baseline creatinine 1.2-1.4 per records from [place]. Creatinine continues to be stable at baseline and is now 1.14 as of this morning.  -Continue to hold Lisinopril  Hypertension -Continue to hold Lisinopril, continue  Metoprolol  Hyperlipidemia -Continue atorvastatin 40mg  qd  DVT prophylaxis: heparin  Code Status: Full code Family Communication: N/A Disposition Plan: Home when improved  Consultants:   Cardiology  Procedures:  2D echo: - Left ventricle: The cavity size was normal. Wall thickness was normal. Systolic function was normal. The estimated ejection fraction was in the range of 60% to 65%. Wall motion was normal;   there were no regional wall motion abnormalities. Doppler parameters are consistent with abnormal left ventricular relaxation (grade 1 diastolic dysfunction). Atrial septum: No defect or patent foramen ovale was identified. Tricuspid valve: There was mild regurgitation. Pulmonary arteries: PA peak pressure: 45 mm Hg Cardiac Cath: Proximal 1st diag lesion 70% stenosed, small vessel no amenable to PCI. Mid LAD 25% stenosis and prox LAD 30% stenosis.   Antimicrobials:  N/A  Subjective: April Booth reports feeling better, but still gets short of breath when she moves around. When she stays in bed she does not have any further episodes of dyspnea or chest tightness. She does not have associated diaphoresis, but did report some nausea with the most recent episode prior to admission.   Objective: Vitals:   01/28/18 1030 01/28/18 1035 01/28/18 1040 01/28/18 1045  BP: 140/60 129/62 133/62 121/61  Pulse: 75 77 78 76  Resp: 15 17 20 15   Temp:      TempSrc:      SpO2: 100% 100% 97% 98%  Weight:      Height:        Intake/Output Summary (Last 24 hours) at 01/28/2018 1207 Last data filed at 01/28/2018 0600 Gross per 24 hour  Intake 963.33 ml  Output -  Net 963.33 ml   Filed Weights   01/25/18 2006 01/26/18 0313 01/28/18 0318  Weight: 60.3 kg (133 lb) 59.9 kg (132 lb 0.9 oz) 60.1 kg (132 lb 6.4 oz)    Examination:  Constitutional: NAD Eyes: PERRL, lids and conjunctivae normal Respiratory: clear to auscultation bilaterally, no wheezing, no crackles. Normal respiratory effort. No  accessory muscle use.  Cardiovascular: Regular rate and rhythm, no murmurs / rubs / gallops. No LE edema. 2+ pedal pulses. No carotid bruits.  Abdomen: no tenderness. Bowel sounds positive.  Musculoskeletal: no clubbing / cyanosis. No joint deformity upper and lower extremities. No contractures. Normal muscle tone.  Skin: no rashes, lesions, ulcers. No induration Neurologic: CN 2-12 grossly intact. Strength 5/5 in all 4.  Psychiatric: Normal judgment and insight. Alert and oriented x 3. Normal mood.    Data Reviewed: I have independently reviewed following labs and imaging studies   CBC: Recent Labs  Lab 01/25/18 2054  WBC 6.5  HGB 13.2  HCT 40.6  MCV 90.0  PLT 924   Basic Metabolic Panel: Recent Labs  Lab 01/25/18 2054 01/26/18 0600 01/27/18 0222 01/28/18 1150  NA 140 138 140  --   K 3.8 3.6 4.4  --   CL 103 100* 103  --   CO2 28 28 30   --   GLUCOSE 101* 101* 96  --   BUN 24* 23* 19  --   CREATININE 1.93* 1.38* 1.31* 1.14*  CALCIUM 9.1 8.8* 9.0  --    GFR: Estimated Creatinine Clearance: 31.1 mL/min (A) (by C-G formula based on SCr of 1.14 mg/dL (H)). Liver Function Tests: Recent  Labs  Lab 01/27/18 0222  AST 19  ALT 15  ALKPHOS 62  BILITOT 0.5  PROT 5.6*  ALBUMIN 3.4*   No results for input(s): LIPASE, AMYLASE in the last 168 hours. No results for input(s): AMMONIA in the last 168 hours. Coagulation Profile: Recent Labs  Lab 01/28/18 0612  INR 0.99   Cardiac Enzymes: Recent Labs  Lab 01/26/18 0325 01/26/18 0600 01/26/18 0821  TROPONINI <0.03 <0.03 <0.03   BNP (last 3 results) No results for input(s): PROBNP in the last 8760 hours. HbA1C: No results for input(s): HGBA1C in the last 72 hours. CBG: No results for input(s): GLUCAP in the last 168 hours. Lipid Profile: Recent Labs    01/27/18 0222  CHOL 212*  HDL 44  LDLCALC 141*  TRIG 136  CHOLHDL 4.8   Thyroid Function Tests: No results for input(s): TSH, T4TOTAL, FREET4, T3FREE,  THYROIDAB in the last 72 hours. Anemia Panel: No results for input(s): VITAMINB12, FOLATE, FERRITIN, TIBC, IRON, RETICCTPCT in the last 72 hours. Urine analysis:    Component Value Date/Time   COLORURINE YELLOW 01/25/2018 2134   APPEARANCEUR CLEAR 01/25/2018 2134   LABSPEC 1.018 01/25/2018 2134   PHURINE 5.0 01/25/2018 2134   GLUCOSEU NEGATIVE 01/25/2018 2134   HGBUR NEGATIVE 01/25/2018 2134   BILIRUBINUR NEGATIVE 01/25/2018 2134   [place] NEGATIVE 01/25/2018 2134   PROTEINUR NEGATIVE 01/25/2018 2134   NITRITE NEGATIVE 01/25/2018 2134   LEUKOCYTESUR TRACE (A) 01/25/2018 2134   Sepsis Labs: Invalid input(s): PROCALCITONIN, LACTICIDVEN  Recent Results (from the past 240 hour(s))  MRSA PCR Screening     Status: None   Collection Time: 01/26/18  3:08 AM  Result Value Ref Range Status   MRSA by PCR NEGATIVE NEGATIVE Final    Comment:        The GeneXpert MRSA Assay (FDA approved for NASAL specimens only), is one component of a comprehensive MRSA colonization surveillance program. It is not intended to diagnose MRSA infection nor to guide or monitor treatment for MRSA infections. Performed at [facility], [place] [address], [place], [place] [address]       Radiology Studies: No results found.   Scheduled Meds: . aspirin EC  81 mg Oral Daily  . atorvastatin  40 mg Oral q1800  . heparin  5,000 Units Subcutaneous Q8H  . metoprolol succinate  50 mg Oral Daily  . sodium chloride flush  3 mL Intravenous Q12H  . zolpidem  5 mg Oral QHS   Continuous Infusions: . sodium chloride 80 mL/hr at 01/28/18 1025  . sodium chloride      Carita Pian, PA-S  Marzetta Board, MD, PhD [facility] Pager [phone]  If 7PM-7AM, please contact night-coverage [email or web address] Password [email or web address] 01/28/2018, 12:07 PM   [email or web address] @

## 2018-01-28 NOTE — Discharge Instructions (Signed)
Please hold your lisinopril / HCTZ for now Follow up with your PCP within a week, have his repeat your kidney function and decide whether you should resume lisinopril  Ask your primary doctor to refer you for PFTs (Pulmonary Function Testing)   Please get a complete blood count and chemistry panel checked by your Primary MD at your next visit, and again as instructed by your Primary MD. Please get your medications reviewed and adjusted by your Primary MD.  Please request your Primary MD to go over all Hospital Tests and Procedure/Radiological results at the follow up, please get all Hospital records sent to your Prim MD by signing hospital release before you go home.  If you had Pneumonia of Lung problems at the Hospital: Please get a 2 view Chest X ray done in 6-8 weeks after hospital discharge or sooner if instructed by your Primary MD.  If you have Congestive Heart Failure: Please call your Cardiologist or Primary MD anytime you have any of the following symptoms:  1) 3 pound weight gain in 24 hours or 5 pounds in 1 week  2) shortness of breath, with or without a dry hacking cough  3) swelling in the hands, feet or stomach  4) if you have to sleep on extra pillows at night in order to breathe  Follow cardiac low salt diet and 1.5 lit/day fluid restriction.  If you have diabetes Accuchecks 4 times/day, Once in AM empty stomach and then before each meal. Log in all results and show them to your primary doctor at your next visit. If any glucose reading is under 80 or above 300 call your primary MD immediately.  If you have Seizure/Convulsions/Epilepsy: Please do not drive, operate heavy machinery, participate in activities at heights or participate in high speed sports until you have seen by Primary MD or a Neurologist and advised to do so again.  If you had Gastrointestinal Bleeding: Please ask your Primary MD to check a complete blood count within one week of discharge or at your  next visit. Your endoscopic/colonoscopic biopsies that are pending at the time of discharge, will also need to followed by your Primary MD.  Get Medicines reviewed and adjusted. Please take all your medications with you for your next visit with your Primary MD  Please request your Primary MD to go over all hospital tests and procedure/radiological results at the follow up, please ask your Primary MD to get all Hospital records sent to his/her office.  If you experience worsening of your admission symptoms, develop shortness of breath, life threatening emergency, suicidal or homicidal thoughts you must seek medical attention immediately by calling 911 or calling your MD immediately  if symptoms less severe.  You must read complete instructions/literature along with all the possible adverse reactions/side effects for all the Medicines you take and that have been prescribed to you. Take any new Medicines after you have completely understood and accpet all the possible adverse reactions/side effects.   Do not drive or operate heavy machinery when taking Pain medications.   Do not take more than prescribed Pain, Sleep and Anxiety Medications  Special Instructions: If you have smoked or chewed Tobacco  in the last 2 yrs please stop smoking, stop any regular Alcohol  and or any Recreational drug use.  Wear Seat belts while driving.  Please note You were cared for by a hospitalist during your hospital stay. If you have any questions about your discharge medications or the care you received while  you were in the hospital after you are discharged, you can call the unit and asked to speak with the hospitalist on call if the hospitalist that took care of you is not available. Once you are discharged, your primary care physician will handle any further medical issues. Please note that NO REFILLS for any discharge medications will be authorized once you are discharged, as it is imperative that you return to  your primary care physician (or establish a relationship with a primary care physician if you do not have one) for your aftercare needs so that they can reassess your need for medications and monitor your lab values.  You can reach the hospitalist office at phone [phone] or fax [phone]   If you do not have a primary care physician, you can call [phone] for a physician referral.  Activity: As tolerated with Full fall precautions use walker/cane & assistance as needed  Diet: heart healthy  Disposition Home

## 2018-01-28 NOTE — Progress Notes (Signed)
Stopped in to visit with April Booth while rounding the floor.  She is happy about her doctor being able to rule out blood clot and also that her Cath showed no blockages.  It was very nice to meet her daughter and see the relationship between them.  Grateful for the medical team caring for her.  Ministry of presence and compassionate presence provided.    01/28/18 1121  Clinical Encounter Type  Visited With Patient and family together  Visit Type Initial;Spiritual support

## 2018-01-30 MED FILL — Heparin Sod (Porcine)-NaCl IV Soln 1000 Unit/500ML-0.9%: INTRAVENOUS | Qty: 1000 | Status: AC

## 2018-01-30 MED FILL — Verapamil HCl IV Soln 2.5 MG/ML: INTRAVENOUS | Qty: 2 | Status: AC

## 2018-01-30 MED FILL — Heparin Sodium (Porcine) Inj 1000 Unit/ML: INTRAMUSCULAR | Qty: 10 | Status: AC

## 2019-04-02 IMAGING — CT CT CHEST W/O CM
2 of 3 series · 15 of 36 positions shown, 18 images · non-contrast
Comparison: 01/28/2018 and 01/25/2018 chest x-ray. 01/28/2018
ventilation perfusion study.

CLINICAL DATA: 82-year-old female with exertional dyspnea over the
past few days. Subsequent encounter.

EXAM:
CT CHEST WITHOUT CONTRAST
TECHNIQUE: Multidetector CT imaging of the chest was performed following the
standard protocol without IV contrast.

[Series 3: chest w/o 2mm st · axial · non-contrast · 0.73mm/px · z∈[-145,+131]mm · 12 of 164 slices shown, 15 images]
[im 13/164  mediastinal]
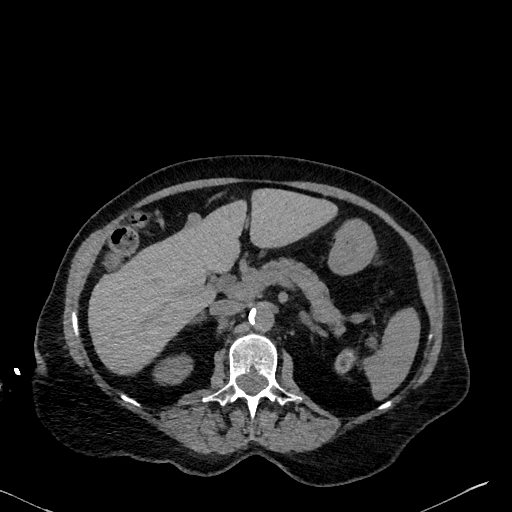
[im 13/164  lung]
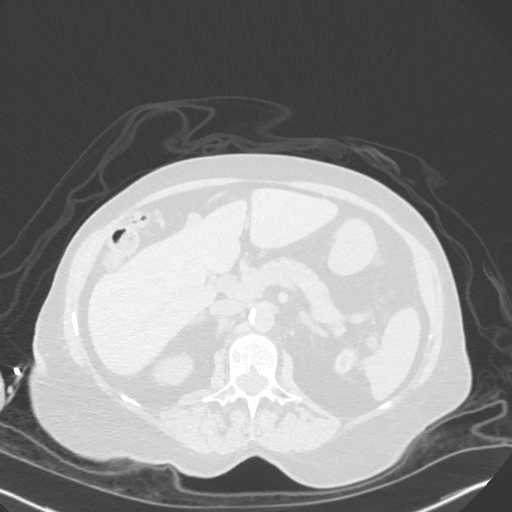
[im 25/164  lung]
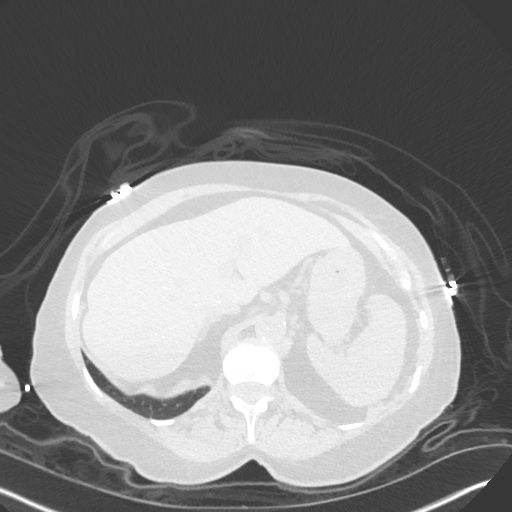
[im 37/164  lung]
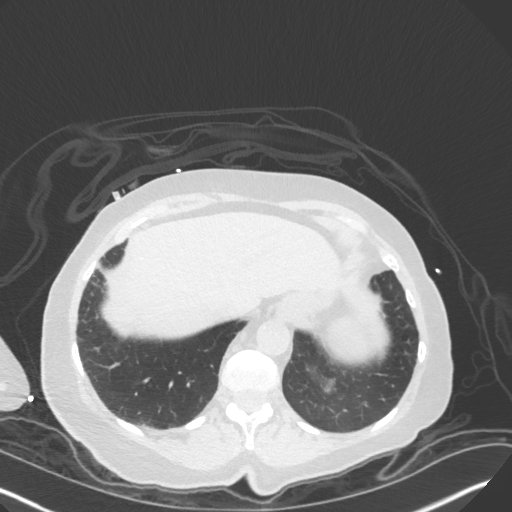
[im 49/164  lung]
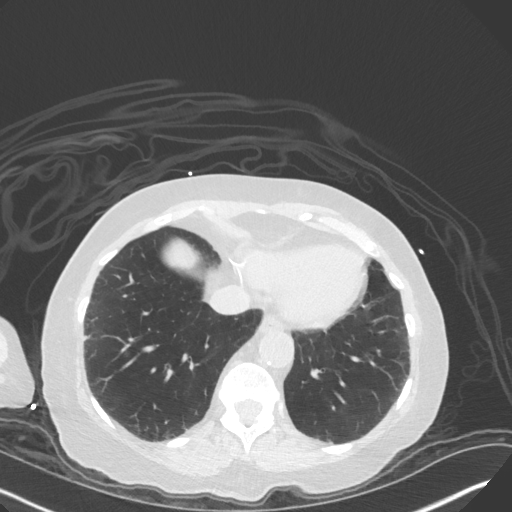
[im 61/164  mediastinal]
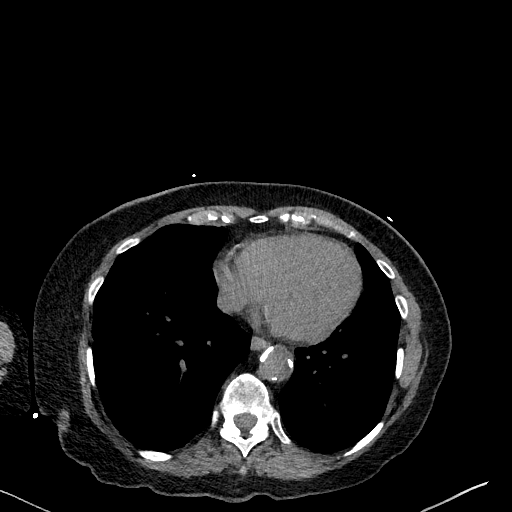
[im 61/164  lung]
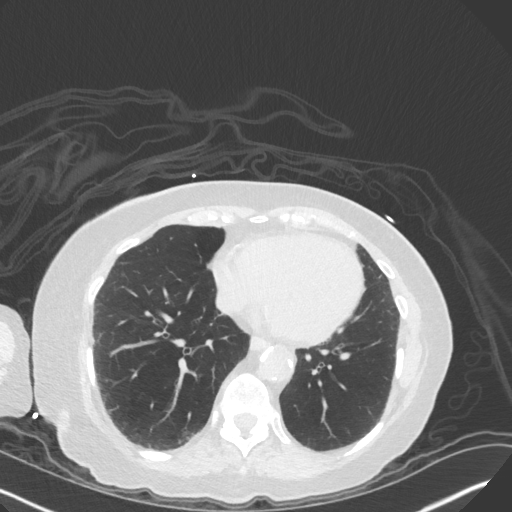
[im 73/164  lung]
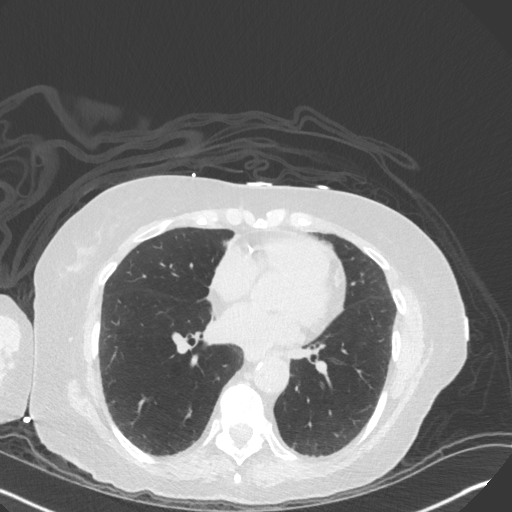
[im 91/164  lung]
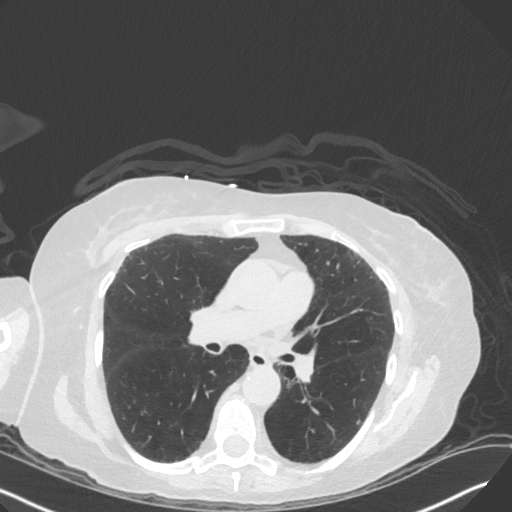
[im 103/164  lung]
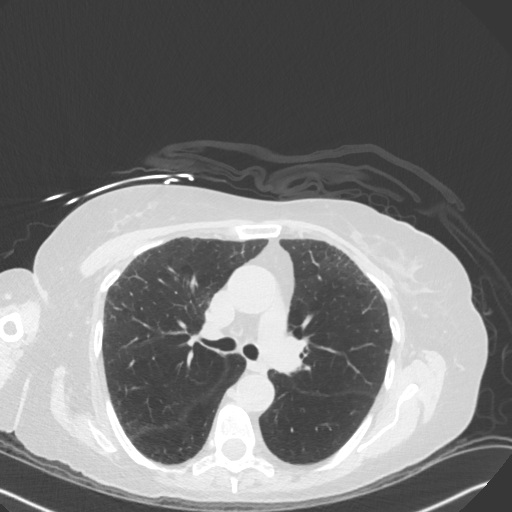
[im 115/164  mediastinal]
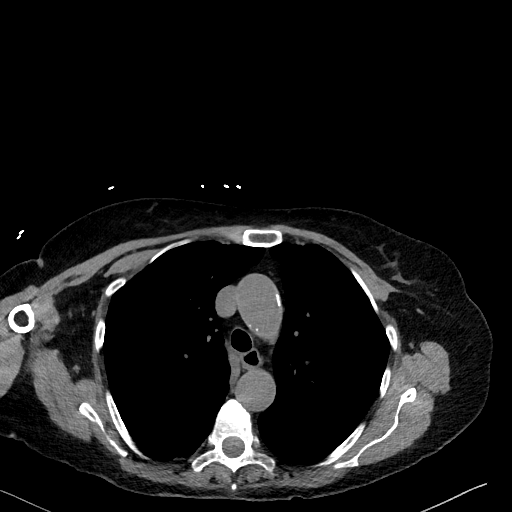
[im 115/164  lung]
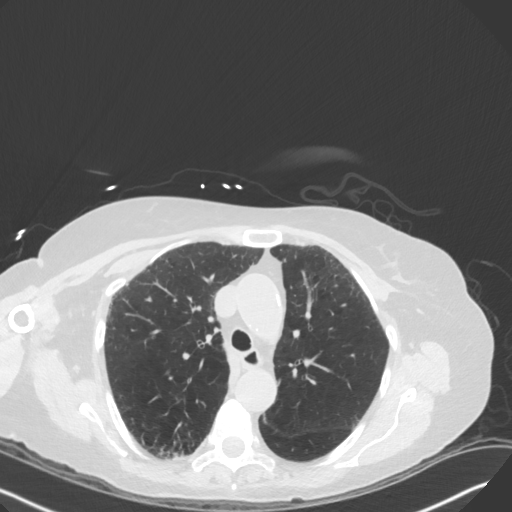
[im 127/164  lung]
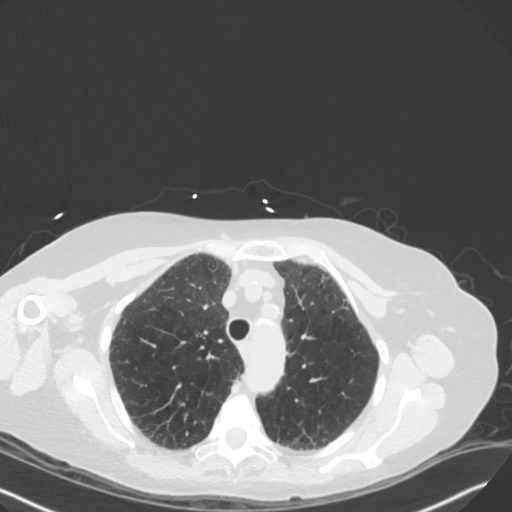
[im 139/164  lung]
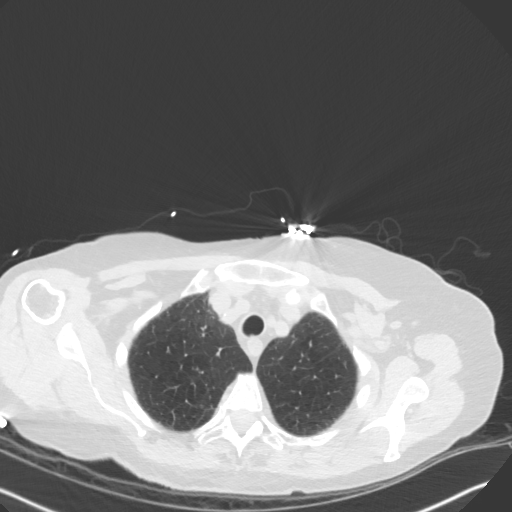
[im 151/164  lung]
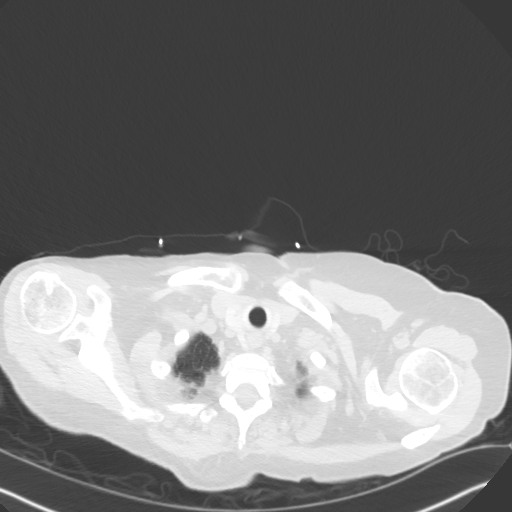

[Series 5: chest w/o 3mm st cor · coronal · non-contrast · 0.59mm/px · 3 of 78 slices shown]
[im 16/78  lung]
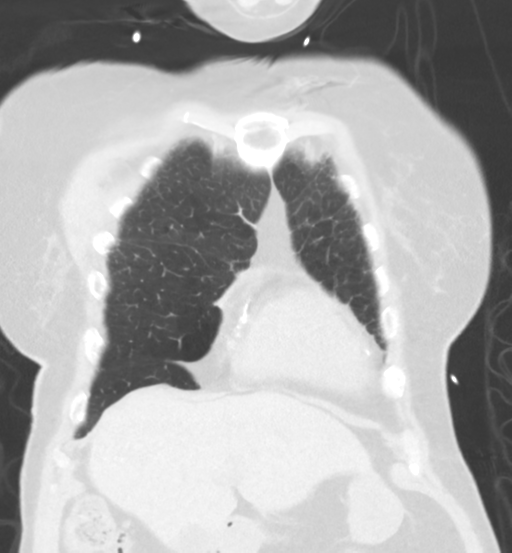
[im 31/78  lung]
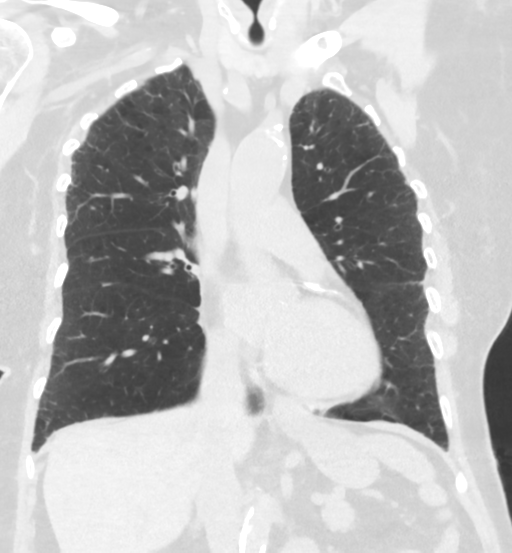
[im 47/78  lung]
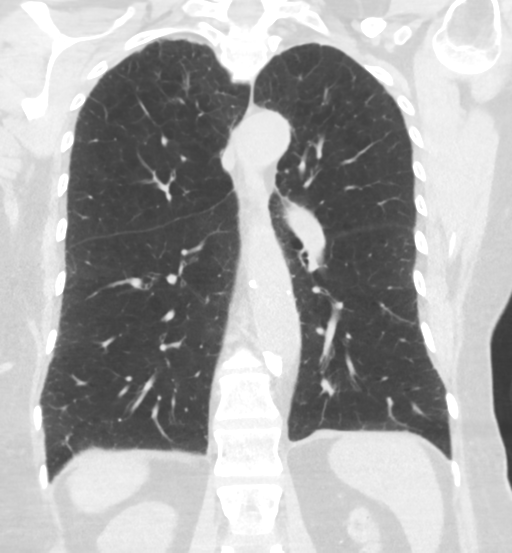

[15 of 36 positions shown; findings below may reference images not displayed]

FINDINGS: Cardiovascular: Heart size within normal limits. Prominent coronary
artery calcifications. Trace pericardial fluid.

Calcified plaque throughout the thoracic aorta. Ascending thoracic
aorta measures up to 3.5 cm. Main pulmonary artery does not appear
enlarged.

Mediastinum/Nodes: Minimal regularity right lobe with thyroid gland.
No mediastinal or hilar adenopathy.

Lungs/Pleura: Nonspecific increased markings peripheral aspect of
the lungs slightly more notable upper lungs.

Trachea and mainstem bronchi are patent. No bronchiectasis or
significant peribronchial thickening.

Six small bilateral noncalcified nodules.. Largest 6 mm (posterior
right lung base series 4, image 124 and anterolateral left lung base
series 4, image 111).

Upper Abdomen: Small hiatal hernia. Left lobe liver low-density
structure possibly a cyst. Radiopaque material right upper pole may
represent residual contrast or renal stone.

Musculoskeletal: Kyphoscoliosis thoracic spine with superimposed
degenerative changes without focal compression fracture.
IMPRESSION: Mild nonspecific increased markings peripheral aspect of the lungs
slightly more notable upper lungs.

Six small bilateral noncalcified nodules. Largest 6 mm (posterior
right lung base series 4, image 124 and anterolateral left lung base
series 4, image 111). Per [REDACTED], patient had nephrectomy for left
renal cell carcinoma in 4114. Therefore, metastatic disease cannot
be excluded although pulmonary nodules may be inflammatory or post
infectious in origin. These are most likely too small to adequately
characterize by PET-CT.

Aortic Atherosclerosis ([id number].Y).

Coronary artery calcifications.

Radiopaque material right upper pole may represent residual contrast
or renal stone.

## 2020-05-31 ENCOUNTER — Ambulatory Visit (HOSPITAL_COMMUNITY)
Admission: RE | Admit: 2020-05-31 | Discharge: 2020-05-31 | Disposition: A | Discharge status: Home / Self Care | Payer: Medicare Other | Source: Ambulatory Visit | Attending: Pulmonary Disease | Admitting: Pulmonary Disease

## 2020-05-31 ENCOUNTER — Other Ambulatory Visit: Payer: Self-pay | Admitting: Physician Assistant

## 2020-05-31 DIAGNOSIS — Z85528 Personal history of other malignant neoplasm of kidney: Secondary | ICD-10-CM | POA: Insufficient documentation

## 2020-05-31 DIAGNOSIS — Z23 Encounter for immunization: Secondary | ICD-10-CM | POA: Diagnosis not present

## 2020-05-31 DIAGNOSIS — I2510 Atherosclerotic heart disease of native coronary artery without angina pectoris: Secondary | ICD-10-CM

## 2020-05-31 DIAGNOSIS — J449 Chronic obstructive pulmonary disease, unspecified: Secondary | ICD-10-CM

## 2020-05-31 DIAGNOSIS — I2583 Coronary atherosclerosis due to lipid rich plaque: Secondary | ICD-10-CM | POA: Insufficient documentation

## 2020-05-31 DIAGNOSIS — U071 COVID-19: Secondary | ICD-10-CM | POA: Insufficient documentation

## 2020-05-31 MED ORDER — FAMOTIDINE IN NACL 20-0.9 MG/50ML-% IV SOLN: 20.0000 mg | Freq: Once | INTRAVENOUS | Status: DC | PRN

## 2020-05-31 MED ORDER — SODIUM CHLORIDE 0.9 % IV SOLN: INTRAVENOUS | Status: DC | PRN

## 2020-05-31 MED ORDER — EPINEPHRINE 0.3 MG/0.3ML IJ SOAJ: 0.3000 mg | Freq: Once | INTRAMUSCULAR | Status: DC | PRN

## 2020-05-31 MED ORDER — SODIUM CHLORIDE 0.9 % IV SOLN
1200.0000 mg | Freq: Once | INTRAVENOUS | Status: AC
  Administered 2020-05-31: 1200 mg via INTRAVENOUS

## 2020-05-31 MED ORDER — DIPHENHYDRAMINE HCL 50 MG/ML IJ SOLN: 50.0000 mg | Freq: Once | INTRAMUSCULAR | Status: DC | PRN

## 2020-05-31 MED ORDER — METHYLPREDNISOLONE SODIUM SUCC 125 MG IJ SOLR: 125.0000 mg | Freq: Once | INTRAMUSCULAR | Status: DC | PRN

## 2020-05-31 MED ORDER — ALBUTEROL SULFATE HFA 108 (90 BASE) MCG/ACT IN AERS: 2.0000 | INHALATION_SPRAY | Freq: Once | RESPIRATORY_TRACT | Status: DC | PRN

## 2020-05-31 NOTE — Discharge Instructions (Signed)

## 2020-05-31 NOTE — Progress Notes (Signed)
I connected by phone with April Booth on 05/31/2020 at 8:38 AM to discuss the potential use of a new treatment for mild to moderate COVID-19 viral infection in non-hospitalized patients.  This patient is a 84 y.o. female that meets the FDA criteria for Emergency Use Authorization of COVID monoclonal antibody casirivimab/imdevimab.  Has a (+) direct SARS-CoV-2 viral test result  Has mild or moderate COVID-19   Is NOT hospitalized due to COVID-19  Is within 10 days of symptom onset  Has at least one of the high risk factor(s) for progression to severe COVID-19 and/or hospitalization as defined in EUA.  Specific high risk criteria : Older age (>/= 82 yo), Immunosuppressive Disease or Treatment, Cardiovascular disease or hypertension and Chronic Lung Disease   I have spoken and communicated the following to the patient or parent/caregiver regarding COVID monoclonal antibody treatment:  1. FDA has authorized the emergency use for the treatment of mild to moderate COVID-19 in adults and pediatric patients with positive results of direct SARS-CoV-2 viral testing who are 88 years of age and older weighing at least 40 kg, and who are at high risk for progressing to severe COVID-19 and/or hospitalization.  2. The significant known and potential risks and benefits of COVID monoclonal antibody, and the extent to which such potential risks and benefits are unknown.  3. Information on available alternative treatments and the risks and benefits of those alternatives, including clinical trials.  4. Patients treated with COVID monoclonal antibody should continue to self-isolate and use infection control measures (e.g., wear mask, isolate, social distance, avoid sharing personal items, clean and disinfect "high touch" surfaces, and frequent handwashing) according to CDC guidelines.   5. The patient or parent/caregiver has the option to accept or refuse COVID monoclonal antibody treatment.  After  reviewing this information with the patient, the patient has agreed to receive one of the available covid 19 monoclonal antibodies and will be provided an appropriate fact sheet prior to infusion.  Sx onset 10/2. Set up for infusion on 10/4 @ 3:30pm. Directions given to [facility]. Pt is aware that insurance will be charged an infusion fee. Pt is fully vaccinated.   [name] Form 05/31/2020 8:38 AM

## 2020-05-31 NOTE — Progress Notes (Signed)
  Diagnosis: COVID-19  Physician: Dr. Asencion Noble  Procedure: Covid Infusion Clinic Med: casirivimab\imdevimab infusion - Provided patient with casirivimab\imdevimab fact sheet for patients, parents and caregivers prior to infusion.  Complications: No immediate complications noted.  Discharge: Discharged home   Brynne Doane, Cathlyn Parsons 05/31/2020

## 2020-06-02 ENCOUNTER — Other Ambulatory Visit (HOSPITAL_COMMUNITY): Payer: Self-pay
